# Patient Record
Sex: Female | Born: 1990 | Race: White | Hispanic: Yes | Marital: Married | Smoking: Never smoker
Health system: Southern US, Community
[De-identification: ages and names within clinical notes are randomized; demographics above are authoritative.]

## PROBLEM LIST (undated history)

## (undated) DIAGNOSIS — K819 Cholecystitis, unspecified: Secondary | ICD-10-CM

---

## 2015-05-12 ENCOUNTER — Encounter (HOSPITAL_COMMUNITY): Payer: Self-pay | Admitting: *Deleted

## 2015-05-12 ENCOUNTER — Emergency Department (HOSPITAL_COMMUNITY)
Admission: EM | Admit: 2015-05-12 | Discharge: 2015-05-12 | Disposition: A | Discharge status: Home / Self Care | Payer: Self-pay | Attending: Emergency Medicine | Admitting: Emergency Medicine

## 2015-05-12 ENCOUNTER — Emergency Department (HOSPITAL_COMMUNITY): Payer: Self-pay

## 2015-05-12 DIAGNOSIS — S99921A Unspecified injury of right foot, initial encounter: Secondary | ICD-10-CM | POA: Insufficient documentation

## 2015-05-12 DIAGNOSIS — Y998 Other external cause status: Secondary | ICD-10-CM | POA: Insufficient documentation

## 2015-05-12 DIAGNOSIS — S93401A Sprain of unspecified ligament of right ankle, initial encounter: Secondary | ICD-10-CM | POA: Insufficient documentation

## 2015-05-12 DIAGNOSIS — Y929 Unspecified place or not applicable: Secondary | ICD-10-CM | POA: Insufficient documentation

## 2015-05-12 DIAGNOSIS — Y9389 Activity, other specified: Secondary | ICD-10-CM | POA: Insufficient documentation

## 2015-05-12 DIAGNOSIS — W109XXA Fall (on) (from) unspecified stairs and steps, initial encounter: Secondary | ICD-10-CM | POA: Insufficient documentation

## 2015-05-12 MED ORDER — NAPROXEN 500 MG PO TABS: 500.0000 mg | ORAL_TABLET | Freq: Two times a day (BID) | ORAL | Status: DC

## 2015-05-12 NOTE — ED Provider Notes (Signed)
CSN: [id number]     Arrival date & time 05/12/15  2542 History  This chart was scribed for Santiago Glad, PA-C, working with Toy Cookey, MD by Chestine Spore, ED Scribe. The patient was seen in room TR06C/TR06C at 7:16 PM.    Chief Complaint  Patient presents with  . Ankle Injury      The history is provided by the patient. No language interpreter was used.    HPI Comments: Sandra Shepard is a 24 y.o. female who presents to the Emergency Department complaining of right ankle onset yesterday. Pt reports that she tripped and fell down stairs and twisted her ankle. Pt reports that she has been able to walk on the ankle and today the pain is better than yesterday. She states that she is having associated symptoms of joint swelling. She states that she has tried ibuprofen with no relief for her symptoms. She denies hitting her head, LOC, numbness, tingling, and any other symptoms.   History reviewed. No pertinent past medical history. History reviewed. No pertinent past surgical history. No family history on file. History  Substance Use Topics  . Smoking status: Never Smoker   . Smokeless tobacco: Not on file  . Alcohol Use: No   OB History    No data available     Review of Systems  Musculoskeletal: Positive for joint swelling and arthralgias. Negative for gait problem.  Neurological: Negative for syncope and numbness.      Allergies  Review of patient's allergies indicates no known allergies.  Home Medications   Prior to Admission medications   Medication Sig Start Date End Date Taking? Authorizing Provider  naproxen (NAPROSYN) 500 MG tablet Take 1 tablet (500 mg total) by mouth 2 (two) times daily. 05/12/15   Santiago Glad, PA-C   LMP 05/12/2015 Physical Exam  Constitutional: She is oriented to person, place, and time. She appears well-developed and well-nourished. No distress.  HENT:  Head: Normocephalic and atraumatic.  Eyes: EOM are normal.  Neck: Neck  supple. No tracheal deviation present.  Cardiovascular: Normal rate, regular rhythm and normal heart sounds.  Exam reveals no gallop and no friction rub.   No murmur heard. Pulses:      Dorsalis pedis pulses are 2+ on the right side.  Pulmonary/Chest: Effort normal and breath sounds normal. No respiratory distress. She has no wheezes. She has no rales.  Musculoskeletal: Normal range of motion.       Right ankle: She exhibits swelling. Tenderness. Lateral malleolus tenderness found.  Swelling over the lateral malleolus of the right foot with tenderness. Pain with flexion of the ankle.  Neurological: She is alert and oriented to person, place, and time.  Distal sensation of all toes intact.   Skin: Skin is warm and dry.  Psychiatric: She has a normal mood and affect. Her behavior is normal.  Nursing note and vitals reviewed.   ED Course  Procedures (including critical care time)    COORDINATION OF CARE: 7:59 PM-Discussed treatment plan which includes ankle ASO and crutchest with pt at bedside and pt agreed to plan.   Labs Review Labs Reviewed - No data to display  Imaging Review Dg Ankle Complete Right  05/12/2015   CLINICAL DATA:  Lateral ankle pain, status post twisting injury  EXAM: RIGHT ANKLE - COMPLETE 3+ VIEW  COMPARISON:  None.  FINDINGS: No fracture or dislocation is seen.  The ankle mortise is intact.  The base of the fifth metatarsal is unremarkable.  Moderate lateral soft  tissue swelling.  IMPRESSION: No fracture or dislocation is seen.   Electronically Signed   By: Charline Bills M.D.   On: 05/12/2015 19:13     EKG Interpretation None      MDM   Final diagnoses:  Ankle sprain, right, initial encounter   Patient presents today with right ankle pain after twisting her ankle.  Xray is negative.  Neurovascularly intact.  Patient given Ankle ASO and crutches.  Stable for discharge.  Return precautions given. I personally performed the services described in this  documentation, which was scribed in my presence. The recorded information has been reviewed and is accurate.    Santiago Glad, PA-C 05/12/15 2001  Toy Cookey, MD 05/13/15 [id number]

## 2015-05-12 NOTE — ED Notes (Signed)
Pt states she tripped and fel yesterday and twisted her rt ankle. C/o pain to area.

## 2016-05-19 ENCOUNTER — Emergency Department (HOSPITAL_COMMUNITY)
Admission: EM | Admit: 2016-05-19 | Discharge: 2016-05-20 | Disposition: A | Discharge status: Home / Self Care | Payer: Self-pay | Attending: Emergency Medicine | Admitting: Emergency Medicine

## 2016-05-19 ENCOUNTER — Encounter (HOSPITAL_COMMUNITY): Payer: Self-pay | Admitting: *Deleted

## 2016-05-19 DIAGNOSIS — K2970 Gastritis, unspecified, without bleeding: Secondary | ICD-10-CM | POA: Insufficient documentation

## 2016-05-19 DIAGNOSIS — Z79899 Other long term (current) drug therapy: Secondary | ICD-10-CM | POA: Insufficient documentation

## 2016-05-19 DIAGNOSIS — O9963 Diseases of the digestive system complicating the puerperium: Secondary | ICD-10-CM | POA: Insufficient documentation

## 2016-05-19 LAB — COMPREHENSIVE METABOLIC PANEL
ALBUMIN: 3.6 g/dL (ref 3.5–5.0)
ALT: 82 U/L — ABNORMAL HIGH (ref 14–54)
ANION GAP: 8 (ref 5–15)
AST: 103 U/L — AB (ref 15–41)
Alkaline Phosphatase: 94 U/L (ref 38–126)
BUN: 10 mg/dL (ref 6–20)
CO2: 26 mmol/L (ref 22–32)
Calcium: 9.3 mg/dL (ref 8.9–10.3)
Chloride: 106 mmol/L (ref 101–111)
Creatinine, Ser: 0.74 mg/dL (ref 0.44–1.00)
GFR calc Af Amer: >60 mL/min (ref >60)
GLUCOSE: 123 mg/dL — AB (ref 65–99)
POTASSIUM: 4 mmol/L (ref 3.5–5.1)
Sodium: 140 mmol/L (ref 135–145)
TOTAL PROTEIN: 7.4 g/dL (ref 6.5–8.1)
Total Bilirubin: 0.7 mg/dL (ref 0.3–1.2)

## 2016-05-19 LAB — URINALYSIS, ROUTINE W REFLEX MICROSCOPIC
Bilirubin Urine: NEGATIVE
Glucose, UA: NEGATIVE mg/dL
Ketones, ur: NEGATIVE mg/dL
NITRITE: NEGATIVE
Protein, ur: NEGATIVE mg/dL
SPECIFIC GRAVITY, URINE: 1.033 — AB (ref 1.005–1.030)
pH: 6 (ref 5.0–8.0)

## 2016-05-19 LAB — CBC
HEMATOCRIT: 41 % (ref 36.0–46.0)
Hemoglobin: 13 g/dL (ref 12.0–15.0)
MCH: 29 pg (ref 26.0–34.0)
MCHC: 31.7 g/dL (ref 30.0–36.0)
MCV: 91.5 fL (ref 78.0–100.0)
Platelets: 212 10*3/uL (ref 150–400)
RBC: 4.48 MIL/uL (ref 3.87–5.11)
RDW: 12.7 % (ref 11.5–15.5)
WBC: 13.4 10*3/uL — AB (ref 4.0–10.5)

## 2016-05-19 LAB — URINE MICROSCOPIC-ADD ON

## 2016-05-19 LAB — LIPASE, BLOOD: Lipase: 32 U/L (ref 11–51)

## 2016-05-19 MED ORDER — HYDROCODONE-ACETAMINOPHEN 5-325 MG PO TABS
1.0000 | ORAL_TABLET | Freq: Once | ORAL | Status: AC
  Administered 2016-05-19: 1 via ORAL
  Filled 2016-05-19: qty 1

## 2016-05-19 MED ORDER — GI COCKTAIL ~~LOC~~
30.0000 mL | Freq: Once | ORAL | Status: AC
  Administered 2016-05-19: 30 mL via ORAL
  Filled 2016-05-19: qty 30

## 2016-05-19 MED ORDER — ONDANSETRON 4 MG PO TBDP
4.0000 mg | ORAL_TABLET | Freq: Once | ORAL | Status: AC
  Administered 2016-05-19: 4 mg via ORAL
  Filled 2016-05-19: qty 1

## 2016-05-19 NOTE — ED Notes (Signed)
The pt is c/o epigastric pain for 3 weeks  Since she delivered her baby.  She is breast feeding.  The pain is associated with food and earlier today her pain increased to 10/10 lmp now from delivery date

## 2016-05-19 NOTE — ED Provider Notes (Signed)
CSN: [id number]     Arrival date & time 05/19/16  2242 History  By signing my name below, I, Sandra Shepard, attest that this documentation has been prepared under the direction and in the presence of Rolland PorterMark Ariona Deschene, MD. Electronically Signed: Bethel BornBritney Shepard, ED Scribe. 05/19/2016. 11:23 PM   Chief Complaint  Patient presents with  . Abdominal Pain   The history is provided by the patient. No language interpreter was used.   Sandra Shepard is a 25 y.o. female who presents to the Emergency Department complaining of daily, 10/10 in severity, "full", epigastric pain with onset months ago. Pt states that she had similar episodes of pain and acidic tasting emesis while pregnant. She gave birth 3 weeks ago and thought that the pain would resolve but it has persisted. The pain is typically worse after eating. She had some relief today after starting Prilosec as prescribed by her doctor but still has some pain with laying flat and standing upright. Pt is breastfeeding  History reviewed. No pertinent past medical history. History reviewed. No pertinent past surgical history. No family history on file. Social History  Substance Use Topics  . Smoking status: Never Smoker   . Smokeless tobacco: None  . Alcohol Use: No   OB History    No data available     Review of Systems  Constitutional: Negative for fever, chills, diaphoresis, appetite change and fatigue.  HENT: Negative for mouth sores, sore throat and trouble swallowing.   Eyes: Negative for visual disturbance.  Respiratory: Negative for cough, chest tightness, shortness of breath and wheezing.   Cardiovascular: Negative for chest pain.  Gastrointestinal: Positive for nausea, vomiting and abdominal pain. Negative for diarrhea and abdominal distention.  Endocrine: Negative for polydipsia, polyphagia and polyuria.  Genitourinary: Negative for dysuria, frequency and hematuria.  Musculoskeletal: Negative for gait problem.  Skin: Negative for  color change, pallor and rash.  Neurological: Negative for dizziness, syncope, light-headedness and headaches.  Hematological: Does not bruise/bleed easily.  Psychiatric/Behavioral: Negative for behavioral problems and confusion.    Allergies  Review of patient's allergies indicates no known allergies.  Home Medications   Prior to Admission medications   Medication Sig Start Date End Date Taking? Authorizing Provider  HYDROcodone-acetaminophen (NORCO/VICODIN) 5-325 MG tablet Take 2 tablets by mouth every 4 (four) hours as needed. 05/20/16   Rolland PorterMark Nena Hampe, MD  naproxen (NAPROSYN) 500 MG tablet Take 1 tablet (500 mg total) by mouth 2 (two) times daily. Patient not taking: Reported on 05/19/2016 05/12/15   Santiago GladHeather Laisure, PA-C  omeprazole (PRILOSEC) 20 MG capsule Take 1 capsule (20 mg total) by mouth 2 (two) times daily. 05/20/16   Rolland PorterMark Jovi Zavadil, MD  ondansetron (ZOFRAN ODT) 4 MG disintegrating tablet Take 1 tablet (4 mg total) by mouth every 8 (eight) hours as needed for nausea. 05/20/16   Rolland PorterMark Kadince Boxley, MD  sucralfate (CARAFATE) 1 g tablet Take 1 tablet (1 g total) by mouth 4 (four) times daily. 05/20/16   Rolland PorterMark Taelyr Jantz, MD   BP 116/87 mmHg  Pulse 68  Temp(Src) 98 F (36.7 C) (Oral)  Resp 20  Ht 5\' 3"  (1.6 m)  Wt 160 lb 3 oz (72.661 kg)  BMI 28.38 kg/m2  SpO2 100%  LMP 05/19/2016 Physical Exam  Constitutional: She is oriented to person, place, and time. She appears well-developed and well-nourished. No distress.  HENT:  Head: Normocephalic.  Eyes: Conjunctivae are normal. Pupils are equal, round, and reactive to light. No scleral icterus.  Neck: Normal range of motion.  Neck supple. No thyromegaly present.  Cardiovascular: Normal rate and regular rhythm.  Exam reveals no gallop and no friction rub.   No murmur heard. Pulmonary/Chest: Effort normal and breath sounds normal. No respiratory distress. She has no wheezes. She has no rales.  Abdominal: Soft. Bowel sounds are normal. She exhibits no  distension. There is tenderness in the epigastric area. There is no rebound.  Musculoskeletal: Normal range of motion.  Neurological: She is alert and oriented to person, place, and time.  Skin: Skin is warm and dry. No rash noted.  Psychiatric: She has a normal mood and affect. Her behavior is normal.    ED Course  Procedures (including critical care time) DIAGNOSTIC STUDIES: Oxygen Saturation is 100% on RA,  normal by my interpretation.    COORDINATION OF CARE: 11:17 PM Discussed treatment plan which includes lab work, GI cocktail, pain medication, and antiemetic medication with pt at bedside and pt agreed to plan.  Labs Review Labs Reviewed  URINE CULTURE - Abnormal; Notable for the following:    Culture MULTIPLE SPECIES PRESENT, SUGGEST RECOLLECTION (*)    All other components within normal limits  COMPREHENSIVE METABOLIC PANEL - Abnormal; Notable for the following:    Glucose, Bld 123 (*)    AST 103 (*)    ALT 82 (*)    All other components within normal limits  CBC - Abnormal; Notable for the following:    WBC 13.4 (*)    All other components within normal limits  URINALYSIS, ROUTINE W REFLEX MICROSCOPIC (NOT AT [facility]) - Abnormal; Notable for the following:    APPearance CLOUDY (*)    Specific Gravity, Urine 1.033 (*)    Hgb urine dipstick LARGE (*)    Leukocytes, UA MODERATE (*)    All other components within normal limits  URINE MICROSCOPIC-ADD ON - Abnormal; Notable for the following:    Squamous Epithelial / LPF 0-5 (*)    Bacteria, UA FEW (*)    All other components within normal limits  LIPASE, BLOOD    Imaging Review No results found. I have personally reviewed and evaluated these lab results as part of my medical decision-making.   EKG Interpretation None      MDM   Final diagnoses:  Gastritis    I personally performed the services described in this documentation, which was scribed in my presence. The recorded information has been reviewed and is  accurate.    Rolland PorterMark Tameca Jerez, MD 05/30/16 [phone]

## 2016-05-20 MED ORDER — OMEPRAZOLE 20 MG PO CPDR: 20.0000 mg | DELAYED_RELEASE_CAPSULE | Freq: Two times a day (BID) | ORAL | Status: DC

## 2016-05-20 MED ORDER — SUCRALFATE 1 G PO TABS: 1.0000 g | ORAL_TABLET | Freq: Four times a day (QID) | ORAL | Status: DC

## 2016-05-20 MED ORDER — HYDROCODONE-ACETAMINOPHEN 5-325 MG PO TABS: 2.0000 | ORAL_TABLET | ORAL | Status: DC | PRN

## 2016-05-20 MED ORDER — SUCRALFATE 1 G PO TABS
1.0000 g | ORAL_TABLET | Freq: Once | ORAL | Status: AC
  Administered 2016-05-20: 1 g via ORAL
  Filled 2016-05-20: qty 1

## 2016-05-20 MED ORDER — MORPHINE SULFATE (PF) 4 MG/ML IV SOLN
8.0000 mg | Freq: Once | INTRAVENOUS | Status: AC
  Administered 2016-05-20: 8 mg via INTRAMUSCULAR
  Filled 2016-05-20: qty 2

## 2016-05-20 MED ORDER — ONDANSETRON 4 MG PO TBDP: 4.0000 mg | ORAL_TABLET | Freq: Three times a day (TID) | ORAL | Status: DC | PRN

## 2016-05-20 NOTE — Discharge Instructions (Signed)
We will have to pump and discard your breast milk for the next 4 hours. If you continue using Vicodin more than once per day you'll have to continue to pump and discard your breast milk. Expect slow gradual improvement in your symptoms. Liquid antiacids Mylanta or Maalox as needed. Small frequent meals.   Gastritis, Adult Gastritis is soreness and puffiness (inflammation) of the lining of the stomach. If you do not get help, gastritis can cause bleeding and sores (ulcers) in the stomach. HOME CARE   Only take medicine as told by your doctor.  If you were given antibiotic medicines, take them as told. Finish the medicines even if you start to feel better.  Drink enough fluids to keep your pee (urine) clear or pale yellow.  Avoid foods and drinks that make your problems worse. Foods you may want to avoid include:  Caffeine or alcohol.  Chocolate.  Mint.  Garlic and onions.  Spicy foods.  Citrus fruits, including oranges, lemons, or limes.  Food containing tomatoes, including sauce, chili, salsa, and pizza.  Fried and fatty foods.  Eat small meals throughout the day instead of large meals. GET HELP RIGHT AWAY IF:   You have black or dark red poop (stools).  You throw up (vomit) blood. It may look like coffee grounds.  You cannot keep fluids down.  Your belly (abdominal) pain gets worse.  You have a fever.  You do not feel better after 1 week.  You have any other questions or concerns. MAKE SURE YOU:   Understand these instructions.  Will watch your condition.  Will get help right away if you are not doing well or get worse.   This information is not intended to replace advice given to you by your health care provider. Make sure you discuss any questions you have with your health care provider.   Document Released: 04/22/2008 Document Revised: 01/27/2012 Document Reviewed: 12/18/2011 Elsevier Interactive Patient Education [employer]2016 [employer] Inc[employer]

## 2016-05-21 LAB — URINE CULTURE

## 2016-06-16 ENCOUNTER — Encounter (HOSPITAL_COMMUNITY): Payer: Self-pay | Admitting: Emergency Medicine

## 2016-06-16 ENCOUNTER — Emergency Department (HOSPITAL_COMMUNITY)
Admission: EM | Admit: 2016-06-16 | Discharge: 2016-06-16 | Disposition: A | Discharge status: Home / Self Care | Payer: Self-pay | Attending: Emergency Medicine | Admitting: Emergency Medicine

## 2016-06-16 DIAGNOSIS — Z5321 Procedure and treatment not carried out due to patient leaving prior to being seen by health care provider: Secondary | ICD-10-CM | POA: Insufficient documentation

## 2016-06-16 DIAGNOSIS — R1011 Right upper quadrant pain: Secondary | ICD-10-CM | POA: Insufficient documentation

## 2016-06-16 LAB — URINALYSIS, ROUTINE W REFLEX MICROSCOPIC
Bilirubin Urine: NEGATIVE
GLUCOSE, UA: NEGATIVE mg/dL
Hgb urine dipstick: NEGATIVE
Ketones, ur: NEGATIVE mg/dL
NITRITE: NEGATIVE
PH: 5 (ref 5.0–8.0)
Protein, ur: NEGATIVE mg/dL
Specific Gravity, Urine: 1.028 (ref 1.005–1.030)

## 2016-06-16 LAB — URINE MICROSCOPIC-ADD ON

## 2016-06-16 LAB — CBC
HEMATOCRIT: 39.6 % (ref 36.0–46.0)
Hemoglobin: 12.7 g/dL (ref 12.0–15.0)
MCH: 29.3 pg (ref 26.0–34.0)
MCHC: 32.1 g/dL (ref 30.0–36.0)
MCV: 91.2 fL (ref 78.0–100.0)
Platelets: 208 10*3/uL (ref 150–400)
RBC: 4.34 MIL/uL (ref 3.87–5.11)
RDW: 13.3 % (ref 11.5–15.5)
WBC: 8.4 10*3/uL (ref 4.0–10.5)

## 2016-06-16 LAB — COMPREHENSIVE METABOLIC PANEL
ALBUMIN: 4 g/dL (ref 3.5–5.0)
ALT: 63 U/L — ABNORMAL HIGH (ref 14–54)
AST: 48 U/L — AB (ref 15–41)
Alkaline Phosphatase: 75 U/L (ref 38–126)
Anion gap: 8 (ref 5–15)
BILIRUBIN TOTAL: 0.9 mg/dL (ref 0.3–1.2)
BUN: 13 mg/dL (ref 6–20)
CO2: 25 mmol/L (ref 22–32)
Calcium: 9.2 mg/dL (ref 8.9–10.3)
Chloride: 106 mmol/L (ref 101–111)
Creatinine, Ser: 0.67 mg/dL (ref 0.44–1.00)
GFR calc Af Amer: >60 mL/min (ref >60)
GFR calc non Af Amer: >60 mL/min (ref >60)
GLUCOSE: 130 mg/dL — AB (ref 65–99)
POTASSIUM: 3.5 mmol/L (ref 3.5–5.1)
Sodium: 139 mmol/L (ref 135–145)
TOTAL PROTEIN: 7.5 g/dL (ref 6.5–8.1)

## 2016-06-16 LAB — POC URINE PREG, ED: PREG TEST UR: NEGATIVE

## 2016-06-16 LAB — LIPASE, BLOOD: Lipase: 32 U/L (ref 11–51)

## 2016-06-16 NOTE — ED Notes (Signed)
Patient decided that she didn't want to be seen cause she felt better.We try to convince her stay.Marland KitchenMarland KitchenPatient stated that she was just going to go ahead and leave.Marland Kitchen

## 2016-06-16 NOTE — ED Triage Notes (Signed)
Pt. reports mid /RUQ abdominal pain with emesis onset yesterday , denies diarrhea , no fever or chills.

## 2016-06-18 DIAGNOSIS — K819 Cholecystitis, unspecified: Secondary | ICD-10-CM

## 2016-06-18 HISTORY — DX: Cholecystitis, unspecified: K81.9

## 2016-06-25 ENCOUNTER — Encounter (HOSPITAL_COMMUNITY): Payer: Self-pay

## 2016-06-25 ENCOUNTER — Observation Stay (HOSPITAL_COMMUNITY)
Admission: EM | Admit: 2016-06-25 | Discharge: 2016-06-27 | Disposition: A | Discharge status: Home / Self Care | Payer: Self-pay | Attending: Surgery | Admitting: Surgery

## 2016-06-25 DIAGNOSIS — K8012 Calculus of gallbladder with acute and chronic cholecystitis without obstruction: Principal | ICD-10-CM | POA: Insufficient documentation

## 2016-06-25 DIAGNOSIS — K8020 Calculus of gallbladder without cholecystitis without obstruction: Secondary | ICD-10-CM | POA: Diagnosis present

## 2016-06-25 DIAGNOSIS — R1011 Right upper quadrant pain: Secondary | ICD-10-CM

## 2016-06-25 DIAGNOSIS — Z419 Encounter for procedure for purposes other than remedying health state, unspecified: Secondary | ICD-10-CM

## 2016-06-25 HISTORY — DX: Cholecystitis, unspecified: K81.9

## 2016-06-25 LAB — URINALYSIS, ROUTINE W REFLEX MICROSCOPIC
Bilirubin Urine: NEGATIVE
GLUCOSE, UA: NEGATIVE mg/dL
Ketones, ur: NEGATIVE mg/dL
NITRITE: NEGATIVE
PH: 7.5 (ref 5.0–8.0)
Protein, ur: NEGATIVE mg/dL
Specific Gravity, Urine: 1.025 (ref 1.005–1.030)

## 2016-06-25 LAB — COMPREHENSIVE METABOLIC PANEL
ALT: 40 U/L (ref 14–54)
ANION GAP: 5 (ref 5–15)
AST: 34 U/L (ref 15–41)
Albumin: 4.1 g/dL (ref 3.5–5.0)
Alkaline Phosphatase: 79 U/L (ref 38–126)
BUN: 7 mg/dL (ref 6–20)
CHLORIDE: 105 mmol/L (ref 101–111)
CO2: 29 mmol/L (ref 22–32)
Calcium: 9.5 mg/dL (ref 8.9–10.3)
Creatinine, Ser: 0.67 mg/dL (ref 0.44–1.00)
Glucose, Bld: 99 mg/dL (ref 65–99)
POTASSIUM: 3.8 mmol/L (ref 3.5–5.1)
Sodium: 139 mmol/L (ref 135–145)
Total Bilirubin: 0.5 mg/dL (ref 0.3–1.2)
Total Protein: 7.6 g/dL (ref 6.5–8.1)

## 2016-06-25 LAB — CBC
HEMATOCRIT: 40 % (ref 36.0–46.0)
HEMOGLOBIN: 13 g/dL (ref 12.0–15.0)
MCH: 29.6 pg (ref 26.0–34.0)
MCHC: 32.5 g/dL (ref 30.0–36.0)
MCV: 91.1 fL (ref 78.0–100.0)
Platelets: 261 10*3/uL (ref 150–400)
RBC: 4.39 MIL/uL (ref 3.87–5.11)
RDW: 12.8 % (ref 11.5–15.5)
WBC: 9.2 10*3/uL (ref 4.0–10.5)

## 2016-06-25 LAB — URINE MICROSCOPIC-ADD ON

## 2016-06-25 LAB — POC URINE PREG, ED: Preg Test, Ur: NEGATIVE

## 2016-06-25 LAB — LIPASE, BLOOD: LIPASE: 28 U/L (ref 11–51)

## 2016-06-25 NOTE — ED Triage Notes (Signed)
Pt c/o RUQ abd pain since she was 5 months pregnant. Vag del 04-27-16.  Pt thought when she had baby pain would stop.  She has been seen in ED for same and dx w/gastritis.  Pain worse after eating sometimes and nothing makes better.  Associated with nausea.  No vomiting, diarrhea,

## 2016-06-26 ENCOUNTER — Encounter (HOSPITAL_COMMUNITY)
Admission: EM | Disposition: A | Discharge status: Home / Self Care | Payer: Self-pay | Source: Home / Self Care | Attending: Emergency Medicine

## 2016-06-26 ENCOUNTER — Emergency Department (HOSPITAL_COMMUNITY): Payer: Self-pay

## 2016-06-26 ENCOUNTER — Observation Stay (HOSPITAL_COMMUNITY): Payer: Self-pay | Admitting: Certified Registered Nurse Anesthetist

## 2016-06-26 ENCOUNTER — Encounter (HOSPITAL_COMMUNITY): Payer: Self-pay | Admitting: General Practice

## 2016-06-26 ENCOUNTER — Observation Stay (HOSPITAL_COMMUNITY): Payer: Self-pay

## 2016-06-26 DIAGNOSIS — K8020 Calculus of gallbladder without cholecystitis without obstruction: Secondary | ICD-10-CM | POA: Diagnosis present

## 2016-06-26 HISTORY — PX: CHOLECYSTECTOMY: SHX55

## 2016-06-26 LAB — SURGICAL PCR SCREEN
MRSA, PCR: NEGATIVE
Staphylococcus aureus: NEGATIVE

## 2016-06-26 SURGERY — LAPAROSCOPIC CHOLECYSTECTOMY WITH INTRAOPERATIVE CHOLANGIOGRAM
Anesthesia: General | Site: Abdomen

## 2016-06-26 MED ORDER — EPHEDRINE SULFATE 50 MG/ML IJ SOLN
INTRAMUSCULAR | Status: DC | PRN
  Administered 2016-06-26: 5 mg via INTRAVENOUS

## 2016-06-26 MED ORDER — FAMOTIDINE IN NACL 20-0.9 MG/50ML-% IV SOLN
20.0000 mg | Freq: Two times a day (BID) | INTRAVENOUS | Status: DC
  Administered 2016-06-26 – 2016-06-27 (×2): 20 mg via INTRAVENOUS
  Filled 2016-06-26 (×3): qty 50

## 2016-06-26 MED ORDER — SODIUM CHLORIDE 0.9 % IR SOLN
Status: DC | PRN
  Administered 2016-06-26: 1000 mL

## 2016-06-26 MED ORDER — DEXTROSE-NACL 5-0.9 % IV SOLN
INTRAVENOUS | Status: DC
  Administered 2016-06-26 – 2016-06-27 (×4): via INTRAVENOUS

## 2016-06-26 MED ORDER — HYDROMORPHONE HCL 1 MG/ML IJ SOLN: 0.5000 mg | INTRAMUSCULAR | Status: DC | PRN

## 2016-06-26 MED ORDER — DIPHENHYDRAMINE HCL 50 MG/ML IJ SOLN: 12.5000 mg | Freq: Four times a day (QID) | INTRAMUSCULAR | Status: DC | PRN

## 2016-06-26 MED ORDER — PHENYLEPHRINE 40 MCG/ML (10ML) SYRINGE FOR IV PUSH (FOR BLOOD PRESSURE SUPPORT)
PREFILLED_SYRINGE | INTRAVENOUS | Status: AC
  Filled 2016-06-26: qty 10

## 2016-06-26 MED ORDER — KETOROLAC TROMETHAMINE 30 MG/ML IJ SOLN
INTRAMUSCULAR | Status: DC | PRN
  Administered 2016-06-26: 30 mg via INTRAVENOUS

## 2016-06-26 MED ORDER — ACETAMINOPHEN 325 MG PO TABS
650.0000 mg | ORAL_TABLET | Freq: Four times a day (QID) | ORAL | Status: DC | PRN
  Administered 2016-06-26 – 2016-06-27 (×3): 650 mg via ORAL
  Filled 2016-06-26 (×3): qty 2

## 2016-06-26 MED ORDER — LIDOCAINE HCL (CARDIAC) 20 MG/ML IV SOLN
INTRAVENOUS | Status: DC | PRN
  Administered 2016-06-26: 40 mg via INTRAVENOUS

## 2016-06-26 MED ORDER — FENTANYL CITRATE (PF) 100 MCG/2ML IJ SOLN
INTRAMUSCULAR | Status: DC | PRN
  Administered 2016-06-26 (×2): 50 ug via INTRAVENOUS
  Administered 2016-06-26: 150 ug via INTRAVENOUS

## 2016-06-26 MED ORDER — LIDOCAINE 2% (20 MG/ML) 5 ML SYRINGE
INTRAMUSCULAR | Status: AC
  Filled 2016-06-26: qty 5

## 2016-06-26 MED ORDER — GLYCOPYRROLATE 0.2 MG/ML IJ SOLN
INTRAMUSCULAR | Status: DC | PRN
  Administered 2016-06-26: 0.2 mg via INTRAVENOUS
  Administered 2016-06-26: .4 mg via INTRAVENOUS

## 2016-06-26 MED ORDER — ROCURONIUM BROMIDE 10 MG/ML (PF) SYRINGE
PREFILLED_SYRINGE | INTRAVENOUS | Status: AC
  Filled 2016-06-26: qty 10

## 2016-06-26 MED ORDER — DIPHENHYDRAMINE HCL 12.5 MG/5ML PO ELIX: 12.5000 mg | ORAL_SOLUTION | Freq: Four times a day (QID) | ORAL | Status: DC | PRN

## 2016-06-26 MED ORDER — BUPIVACAINE-EPINEPHRINE (PF) 0.25% -1:200000 IJ SOLN
INTRAMUSCULAR | Status: AC
  Filled 2016-06-26: qty 30

## 2016-06-26 MED ORDER — ONDANSETRON HCL 4 MG/2ML IJ SOLN
INTRAMUSCULAR | Status: DC | PRN
Start: 2016-06-26 — End: 2016-06-26
  Administered 2016-06-26: 4 mg via INTRAVENOUS

## 2016-06-26 MED ORDER — MORPHINE SULFATE (PF) 4 MG/ML IV SOLN
INTRAVENOUS | Status: AC
  Filled 2016-06-26: qty 1

## 2016-06-26 MED ORDER — SODIUM CHLORIDE 0.9 % IV SOLN
INTRAVENOUS | Status: DC | PRN
  Administered 2016-06-26: 12 mL

## 2016-06-26 MED ORDER — ACETAMINOPHEN 650 MG RE SUPP: 650.0000 mg | Freq: Four times a day (QID) | RECTAL | Status: DC | PRN

## 2016-06-26 MED ORDER — SIMETHICONE 80 MG PO CHEW: 40.0000 mg | CHEWABLE_TABLET | Freq: Four times a day (QID) | ORAL | Status: DC | PRN

## 2016-06-26 MED ORDER — ONDANSETRON 4 MG PO TBDP
4.0000 mg | ORAL_TABLET | Freq: Four times a day (QID) | ORAL | Status: DC | PRN
Start: 2016-06-26 — End: 2016-06-26

## 2016-06-26 MED ORDER — DEXTROSE 5 % IV SOLN
INTRAVENOUS | Status: AC
  Filled 2016-06-26: qty 2

## 2016-06-26 MED ORDER — ONDANSETRON HCL 4 MG/2ML IJ SOLN
INTRAMUSCULAR | Status: AC
Start: 2016-06-26 — End: 2016-06-26
  Filled 2016-06-26: qty 2

## 2016-06-26 MED ORDER — CEFAZOLIN SODIUM-DEXTROSE 2-4 GM/100ML-% IV SOLN
2.0000 g | Freq: Once | INTRAVENOUS | Status: AC
  Administered 2016-06-26: 2 g via INTRAVENOUS
  Filled 2016-06-26: qty 100

## 2016-06-26 MED ORDER — PROPOFOL 10 MG/ML IV BOLUS
INTRAVENOUS | Status: AC
  Filled 2016-06-26: qty 20

## 2016-06-26 MED ORDER — SODIUM CHLORIDE 0.9 % IV SOLN
INTRAVENOUS | Status: DC
  Administered 2016-06-26: 04:00:00 via INTRAVENOUS

## 2016-06-26 MED ORDER — ENOXAPARIN SODIUM 40 MG/0.4ML ~~LOC~~ SOLN
40.0000 mg | SUBCUTANEOUS | Status: DC
  Administered 2016-06-27: 40 mg via SUBCUTANEOUS
  Filled 2016-06-26: qty 0.4

## 2016-06-26 MED ORDER — ONDANSETRON HCL 4 MG/2ML IJ SOLN
4.0000 mg | Freq: Four times a day (QID) | INTRAMUSCULAR | Status: DC | PRN
  Administered 2016-06-26 – 2016-06-27 (×4): 4 mg via INTRAVENOUS
  Filled 2016-06-26 (×4): qty 2

## 2016-06-26 MED ORDER — MIDAZOLAM HCL 5 MG/5ML IJ SOLN
INTRAMUSCULAR | Status: DC | PRN
  Administered 2016-06-26 (×2): 1 mg via INTRAVENOUS

## 2016-06-26 MED ORDER — FENTANYL CITRATE (PF) 250 MCG/5ML IJ SOLN
INTRAMUSCULAR | Status: AC
  Filled 2016-06-26: qty 5

## 2016-06-26 MED ORDER — ONDANSETRON HCL 4 MG/2ML IJ SOLN
INTRAMUSCULAR | Status: AC
  Filled 2016-06-26: qty 2

## 2016-06-26 MED ORDER — OXYCODONE HCL 5 MG PO TABS
5.0000 mg | ORAL_TABLET | ORAL | Status: DC | PRN
  Administered 2016-06-26 – 2016-06-27 (×3): 10 mg via ORAL
  Administered 2016-06-27 (×2): 5 mg via ORAL
  Filled 2016-06-26: qty 1
  Filled 2016-06-26 (×2): qty 2
  Filled 2016-06-26: qty 1
  Filled 2016-06-26: qty 2

## 2016-06-26 MED ORDER — IOPAMIDOL (ISOVUE-300) INJECTION 61%
INTRAVENOUS | Status: AC
  Filled 2016-06-26: qty 50

## 2016-06-26 MED ORDER — ONDANSETRON HCL 4 MG/2ML IJ SOLN: 4.0000 mg | Freq: Four times a day (QID) | INTRAMUSCULAR | Status: DC | PRN

## 2016-06-26 MED ORDER — MORPHINE SULFATE (PF) 2 MG/ML IV SOLN: 1.0000 mg | INTRAVENOUS | Status: DC | PRN

## 2016-06-26 MED ORDER — HYDROMORPHONE HCL 1 MG/ML IJ SOLN: 1.0000 mg | INTRAMUSCULAR | Status: DC | PRN

## 2016-06-26 MED ORDER — DEXTROSE 5 % IV SOLN
2.0000 g | INTRAVENOUS | Status: DC
  Administered 2016-06-26: 2 g via INTRAVENOUS

## 2016-06-26 MED ORDER — ONDANSETRON HCL 4 MG/2ML IJ SOLN
4.0000 mg | Freq: Once | INTRAMUSCULAR | Status: AC
  Administered 2016-06-26: 4 mg via INTRAVENOUS

## 2016-06-26 MED ORDER — MORPHINE SULFATE (PF) 4 MG/ML IV SOLN
INTRAVENOUS | Status: AC
  Administered 2016-06-26: 4 mg
  Filled 2016-06-26: qty 1

## 2016-06-26 MED ORDER — MORPHINE SULFATE (PF) 4 MG/ML IV SOLN: 4.0000 mg | Freq: Once | INTRAVENOUS | Status: DC

## 2016-06-26 MED ORDER — PROPOFOL 10 MG/ML IV BOLUS
INTRAVENOUS | Status: DC | PRN
  Administered 2016-06-26: 130 mg via INTRAVENOUS
  Administered 2016-06-26: 30 mg via INTRAVENOUS

## 2016-06-26 MED ORDER — SODIUM CHLORIDE 0.9 % IV SOLN: INTRAVENOUS | Status: DC

## 2016-06-26 MED ORDER — BUPIVACAINE-EPINEPHRINE 0.25% -1:200000 IJ SOLN
INTRAMUSCULAR | Status: DC | PRN
  Administered 2016-06-26: 30 mL

## 2016-06-26 MED ORDER — PHENYLEPHRINE HCL 10 MG/ML IJ SOLN
INTRAMUSCULAR | Status: DC | PRN
  Administered 2016-06-26: 40 ug via INTRAVENOUS
  Administered 2016-06-26: 80 ug via INTRAVENOUS

## 2016-06-26 MED ORDER — LACTATED RINGERS IV SOLN
INTRAVENOUS | Status: DC | PRN
Start: 2016-06-26 — End: 2016-06-26
  Administered 2016-06-26 (×2): via INTRAVENOUS

## 2016-06-26 MED ORDER — NEOSTIGMINE METHYLSULFATE 10 MG/10ML IV SOLN
INTRAVENOUS | Status: DC | PRN
  Administered 2016-06-26: 1 mg via INTRAVENOUS
  Administered 2016-06-26: 3 mg via INTRAVENOUS

## 2016-06-26 MED ORDER — KETOROLAC TROMETHAMINE 30 MG/ML IJ SOLN
INTRAMUSCULAR | Status: AC
  Filled 2016-06-26: qty 2

## 2016-06-26 MED ORDER — ONDANSETRON 4 MG PO TBDP: 4.0000 mg | ORAL_TABLET | Freq: Four times a day (QID) | ORAL | Status: DC | PRN

## 2016-06-26 MED ORDER — 0.9 % SODIUM CHLORIDE (POUR BTL) OPTIME
TOPICAL | Status: DC | PRN
  Administered 2016-06-26: 1000 mL

## 2016-06-26 MED ORDER — MIDAZOLAM HCL 2 MG/2ML IJ SOLN
INTRAMUSCULAR | Status: AC
  Filled 2016-06-26: qty 2

## 2016-06-26 MED ORDER — ROCURONIUM BROMIDE 100 MG/10ML IV SOLN
INTRAVENOUS | Status: DC | PRN
  Administered 2016-06-26: 10 mg via INTRAVENOUS
  Administered 2016-06-26: 40 mg via INTRAVENOUS

## 2016-06-26 MED ORDER — EPHEDRINE 5 MG/ML INJ
INTRAVENOUS | Status: AC
  Filled 2016-06-26: qty 10

## 2016-06-26 SURGICAL SUPPLY — 43 items
APPLIER CLIP 5 13 M/L LIGAMAX5 (MISCELLANEOUS) ×3
BANDAGE ADH SHEER 1  50/CT (GAUZE/BANDAGES/DRESSINGS) ×9 IMPLANT
BENZOIN TINCTURE PRP APPL 2/3 (GAUZE/BANDAGES/DRESSINGS) ×3 IMPLANT
BLADE SURG ROTATE 9660 (MISCELLANEOUS) IMPLANT
CANISTER SUCTION 2500CC (MISCELLANEOUS) ×3 IMPLANT
CHLORAPREP W/TINT 26ML (MISCELLANEOUS) ×3 IMPLANT
CLIP APPLIE 5 13 M/L LIGAMAX5 (MISCELLANEOUS) ×1 IMPLANT
CLOSURE WOUND 1/2 X4 (GAUZE/BANDAGES/DRESSINGS) ×1
COVER MAYO STAND STRL (DRAPES) ×3 IMPLANT
COVER SURGICAL LIGHT HANDLE (MISCELLANEOUS) ×3 IMPLANT
DRAPE C-ARM 42X72 X-RAY (DRAPES) ×3 IMPLANT
DRSG TEGADERM 4X4.75 (GAUZE/BANDAGES/DRESSINGS) ×3 IMPLANT
ELECT REM PT RETURN 9FT ADLT (ELECTROSURGICAL) ×3
ELECTRODE REM PT RTRN 9FT ADLT (ELECTROSURGICAL) ×1 IMPLANT
GAUZE SPONGE 2X2 8PLY STRL LF (GAUZE/BANDAGES/DRESSINGS) ×1 IMPLANT
GLOVE BIOGEL M STRL SZ7.5 (GLOVE) ×3 IMPLANT
GLOVE BIOGEL PI IND STRL 8 (GLOVE) ×1 IMPLANT
GLOVE BIOGEL PI INDICATOR 8 (GLOVE) ×2
GOWN STRL REUS W/ TWL LRG LVL3 (GOWN DISPOSABLE) ×3 IMPLANT
GOWN STRL REUS W/ TWL XL LVL3 (GOWN DISPOSABLE) ×1 IMPLANT
GOWN STRL REUS W/TWL LRG LVL3 (GOWN DISPOSABLE) ×6
GOWN STRL REUS W/TWL XL LVL3 (GOWN DISPOSABLE) ×2
KIT BASIN OR (CUSTOM PROCEDURE TRAY) ×3 IMPLANT
KIT ROOM TURNOVER OR (KITS) ×3 IMPLANT
NEEDLE HYPO 25GX1X1/2 BEV (NEEDLE) ×6 IMPLANT
NS IRRIG 1000ML POUR BTL (IV SOLUTION) ×3 IMPLANT
PAD ARMBOARD 7.5X6 YLW CONV (MISCELLANEOUS) ×3 IMPLANT
POUCH RETRIEVAL ECOSAC 10 (ENDOMECHANICALS) ×1 IMPLANT
POUCH RETRIEVAL ECOSAC 10MM (ENDOMECHANICALS) ×2
SCISSORS LAP 5X35 DISP (ENDOMECHANICALS) ×3 IMPLANT
SET CHOLANGIOGRAPH 5 50 .035 (SET/KITS/TRAYS/PACK) ×3 IMPLANT
SET IRRIG TUBING LAPAROSCOPIC (IRRIGATION / IRRIGATOR) ×3 IMPLANT
SLEEVE ENDOPATH XCEL 5M (ENDOMECHANICALS) ×6 IMPLANT
SPECIMEN JAR SMALL (MISCELLANEOUS) ×3 IMPLANT
SPONGE GAUZE 2X2 STER 10/PKG (GAUZE/BANDAGES/DRESSINGS) ×2
STRIP CLOSURE SKIN 1/2X4 (GAUZE/BANDAGES/DRESSINGS) ×2 IMPLANT
SUT MNCRL AB 4-0 PS2 18 (SUTURE) ×6 IMPLANT
TOWEL OR 17X24 6PK STRL BLUE (TOWEL DISPOSABLE) ×3 IMPLANT
TOWEL OR 17X26 10 PK STRL BLUE (TOWEL DISPOSABLE) ×3 IMPLANT
TRAY LAPAROSCOPIC MC (CUSTOM PROCEDURE TRAY) ×3 IMPLANT
TROCAR XCEL BLUNT TIP 100MML (ENDOMECHANICALS) ×3 IMPLANT
TROCAR XCEL NON-BLD 5MMX100MML (ENDOMECHANICALS) ×3 IMPLANT
TUBING INSUFFLATION (TUBING) ×3 IMPLANT

## 2016-06-26 NOTE — Op Note (Signed)
Sandra Shepard [id number] December 21, 1990 06/26/2016  Laparoscopic Cholecystectomy with IOC Procedure Note  Indications: This patient presents with symptomatic gallbladder disease and will undergo laparoscopic cholecystectomy. Please see h&p for additional details.   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 The risks, benefits, complications, treatment options, and expected outcomes were discussed with the patient. The possibilities of reaction to medication, pulmonary aspiration, perforation of viscus, bleeding, recurrent infection, finding a normal gallbladder, the need for additional procedures, failure to diagnose a condition, the possible need to convert to an open procedure, and creating a complication requiring transfusion or operation were discussed with the patient. The likelihood of improving the patient's symptoms with return to their baseline status is good.  The patient and/or family concurred with the proposed plan, giving informed consent. The site of surgery properly noted. The patient was taken to Operating Room, identified as Sandra Shepard and the procedure verified as Laparoscopic Cholecystectomy with Intraoperative Cholangiogram. A Time Out was held and the above information confirmed. Antibiotic prophylaxis was administered.   Prior to the induction of general anesthesia, antibiotic prophylaxis was administered. General endotracheal anesthesia was then administered and tolerated well. After the induction, the abdomen was prepped with Chloraprep and draped in the sterile fashion. The patient was positioned in the supine position.  Local anesthetic agent was injected into the skin near the umbilicus and an incision made. We dissected down to the  abdominal fascia with blunt dissection.  The fascia was incised vertically and we entered the peritoneal cavity bluntly.  A pursestring suture of 0-Vicryl was placed around the fascial opening.  The Hasson cannula was inserted and secured with the stay suture.  Pneumoperitoneum was then created with CO2 and tolerated well without any adverse changes in the patient's vital signs. An 5-mm port was placed in the subxiphoid position.  Two 5-mm ports were placed in the right upper quadrant. All skin incisions were infiltrated with a local anesthetic agent before making the incision and placing the trocars.   We positioned the patient in reverse Trendelenburg, tilted slightly to the patient's left.  The gallbladder was identified, the fundus grasped and retracted cephalad. Adhesions were lysed bluntly and with the electrocautery where indicated, taking care not to injure any adjacent organs or viscus. The infundibulum was grasped and retracted laterally, exposing the peritoneum overlying the triangle of Calot. This was then divided and exposed in a blunt fashion. A critical view of the cystic duct and cystic artery was obtained.  The cystic duct was clearly identified and bluntly dissected circumferentially. The cystic duct was ligated with a clip distally.   An incision was made in the cystic duct and the [facility] cholangiogram catheter introduced. The catheter was secured using a clip. A cholangiogram was then obtained which showed good visualization of the distal and proximal biliary tree with no sign of filling defects or obstruction.  Contrast flowed easily into the duodenum. The catheter was then removed.   The cystic duct was then ligated with clips and divided. The cystic artery which had been identified & dissected free was ligated with clips and divided as well.   The gallbladder was dissected from the liver bed in retrograde fashion with the electrocautery. The gallbladder was removed and placed in an Ecco  sac.  The gallbladder and Ecco sac were then removed through  the umbilical port site. The liver bed was irrigated and inspected. Hemostasis was achieved with the electrocautery. Copious irrigation was utilized and was repeatedly aspirated until clear.  The pursestring suture was used to close the umbilical fascia.    We again inspected the right upper quadrant for hemostasis.  The umbilical closure was inspected and there was no air leak and nothing trapped within the closure. Pneumoperitoneum was released as we removed the trocars.  4-0 Monocryl was used to close the skin.   Benzoin, steri-strips, and clean dressings were applied. The patient was then extubated and brought to the recovery room in stable condition. Instrument, sponge, and needle counts were correct at closure and at the conclusion of the case.   Findings: Cholecystitis with Cholelithiasis  Estimated Blood Loss: Minimal         Drains: none         Specimens: Gallbladder           Complications: None; patient tolerated the procedure well.         Disposition: PACU - hemodynamically stable.         Condition: stable  Mary SellaEric M. Andrey CampanileWilson, MD, FACS General, Bariatric, & Minimally Invasive Surgery [facility] [place]

## 2016-06-26 NOTE — H&P (Signed)
Sandra Shepard is an 25 y.o. female.   Chief Complaint: Epigastric abdominal pain HPI: Patient is a 25 year old female with approximately a 2 month history of on and off right upper quadrant/epigastric abdominal pain. Patient states that multiple bouts since been pregnant. Patient's pregnancy was delivered on June 10. Patient states that this episode began last night after eating melon, and She states this was associated with nausea and vomiting. Secondary to continued pain and nausea patient proceeded to the ER for further evaluation  Upon evaluation ER patient underwent ultrasound which revealed a gallbladder with multiple gallstones. Patient also had a common bile duct of 1 cm. There is no common duct stones. Patient's laboratory values were within normal limits.  Surgical consultation was requested for further treatment.  History reviewed. No pertinent past medical history.  History reviewed. No pertinent surgical history.  History reviewed. No pertinent family history. Social History:  reports that she has never smoked. She has never used smokeless tobacco. She reports that she does not drink alcohol or use drugs.  Allergies: No Known Allergies   (Not in a hospital admission)  Results for orders placed or performed during the hospital encounter of 06/25/16 (from the past 48 hour(s))  Lipase, blood     Status: None   Collection Time: 06/25/16  7:57 PM  Result Value Ref Range   Lipase 28 11 - 51 U/L  Comprehensive metabolic panel     Status: None   Collection Time: 06/25/16  7:57 PM  Result Value Ref Range   Sodium 139 135 - 145 mmol/L   Potassium 3.8 3.5 - 5.1 mmol/L   Chloride 105 101 - 111 mmol/L   CO2 29 22 - 32 mmol/L   Glucose, Bld 99 65 - 99 mg/dL   BUN 7 6 - 20 mg/dL   Creatinine, Ser 0.67 0.44 - 1.00 mg/dL   Calcium 9.5 8.9 - 10.3 mg/dL   Total Protein 7.6 6.5 - 8.1 g/dL   Albumin 4.1 3.5 - 5.0 g/dL   AST 34 15 - 41 U/L   ALT 40 14 - 54 U/L   Alkaline Phosphatase  79 38 - 126 U/L   Total Bilirubin 0.5 0.3 - 1.2 mg/dL   GFR calc non Af Amer >60 >60 mL/min   GFR calc Af Amer >60 >60 mL/min    Comment: (NOTE) The eGFR has been calculated using the CKD EPI equation. This calculation has not been validated in all clinical situations. eGFR's persistently <60 mL/min signify possible Chronic Kidney Disease.    Anion gap 5 5 - 15  CBC     Status: None   Collection Time: 06/25/16  7:57 PM  Result Value Ref Range   WBC 9.2 4.0 - 10.5 K/uL   RBC 4.39 3.87 - 5.11 MIL/uL   Hemoglobin 13.0 12.0 - 15.0 g/dL   HCT 40.0 36.0 - 46.0 %   MCV 91.1 78.0 - 100.0 fL   MCH 29.6 26.0 - 34.0 pg   MCHC 32.5 30.0 - 36.0 g/dL   RDW 12.8 11.5 - 15.5 %   Platelets 261 150 - 400 K/uL  Urinalysis, Routine w reflex microscopic     Status: Abnormal   Collection Time: 06/25/16  8:00 PM  Result Value Ref Range   Color, Urine YELLOW YELLOW   APPearance CLOUDY (A) CLEAR   Specific Gravity, Urine 1.025 1.005 - 1.030   pH 7.5 5.0 - 8.0   Glucose, UA NEGATIVE NEGATIVE mg/dL   Hgb urine dipstick LARGE (  A) NEGATIVE   Bilirubin Urine NEGATIVE NEGATIVE   Ketones, ur NEGATIVE NEGATIVE mg/dL   Protein, ur NEGATIVE NEGATIVE mg/dL   Nitrite NEGATIVE NEGATIVE   Leukocytes, UA MODERATE (A) NEGATIVE  Urine microscopic-add on     Status: Abnormal   Collection Time: 06/25/16  8:00 PM  Result Value Ref Range   Squamous Epithelial / LPF 6-30 (A) NONE SEEN   WBC, UA 0-5 0 - 5 WBC/hpf   RBC / HPF 0-5 0 - 5 RBC/hpf   Bacteria, UA MANY (A) NONE SEEN   Urine-Other MUCOUS PRESENT   POC Urine Pregnancy, ED (do NOT order at [facility])     Status: None   Collection Time: 06/25/16  8:27 PM  Result Value Ref Range   Preg Test, Ur NEGATIVE NEGATIVE    Comment:        THE SENSITIVITY OF THIS METHODOLOGY IS >24 mIU/mL    US Abdomen Complete  Result Date: 06/26/2016 CLINICAL DATA:  Initial evaluation for acute right upper quadrant abdominal pain. EXAM: ABDOMEN ULTRASOUND COMPLETE COMPARISON:  None.  FINDINGS: Gallbladder: Several echogenic stones present within the gallbladder lumen, largest of which measured approximately 7 mm. Gallbladder wall measure within normal limits at up to 3 mm. No free pericholecystic fluid. No sonographic Murphy sign elicited on exam. Common bile duct: Diameter: Dilated up to 1 cm. No choledocholithiasis identified on this exam. Liver: No focal lesion identified. Within normal limits in parenchymal echogenicity. IVC: No abnormality visualized. Pancreas: Visualized portion unremarkable. Spleen: Size and appearance within normal limits. Right Kidney: Length: 10.3 cm. Echogenicity within normal limits. No mass or hydronephrosis visualized. Left Kidney: Length: 8.3 cm. Echogenicity within normal limits. No mass or hydronephrosis visualized. Abdominal aorta: No aneurysm visualized. Other findings: None. IMPRESSION: 1. Cholelithiasis without other sonographic features to suggest acute cholecystitis. 2. Dilatation of the common bile duct up to 1 cm. No choledocholithiasis identified on this exam. 3. Otherwise negative abdominal ultrasound. Electronically Signed   By: Jeannine Boga M.D.   On: 06/26/2016 02:08    Review of Systems  Constitutional: Negative for chills, diaphoresis, fever, malaise/fatigue and weight loss.  HENT: Negative for ear pain, hearing loss and tinnitus.   Eyes: Negative for blurred vision, double vision and photophobia.  Respiratory: Negative for cough, hemoptysis and sputum production.   Cardiovascular: Negative for chest pain, palpitations, orthopnea and claudication.  Gastrointestinal: Positive for abdominal pain, nausea and vomiting. Negative for diarrhea, heartburn and melena.  Genitourinary: Negative for dysuria, frequency and urgency.  Musculoskeletal: Negative for myalgias and neck pain.  Skin: Negative for itching and rash.  Neurological: Negative for dizziness, tingling, tremors, weakness and headaches.    Blood pressure 114/80, pulse  71, temperature 98.1 F (36.7 C), temperature source Oral, resp. rate 19, height _0  (1.575 m), weight 71.8 kg (158 lb 3 oz), last menstrual period 04/23/2016, SpO2 98 %. Physical Exam  Constitutional: She is oriented to person, place, and time. She appears well-developed and well-nourished. No distress.  HENT:  Head: Normocephalic and atraumatic.  Right Ear: External ear normal.  Left Ear: External ear normal.  Eyes: Conjunctivae and EOM are normal. Pupils are equal, round, and reactive to light.  Neck: Normal range of motion. Neck supple. No tracheal deviation present.  Cardiovascular: Normal rate and regular rhythm.  Exam reveals friction rub. Exam reveals no gallop.   No murmur heard. Respiratory: Effort normal and breath sounds normal. No stridor. No respiratory distress. She has no wheezes. She has no rales. She  exhibits no tenderness.  GI: Soft. Bowel sounds are normal. She exhibits no distension and no mass. There is tenderness (RUQ). There is no rebound and no guarding.  Musculoskeletal: Normal range of motion.  Neurological: She is alert and oriented to person, place, and time.  Skin: Skin is warm and dry. She is not diaphoretic.  Psychiatric: She has a normal mood and affect. Her behavior is normal.     Assessment/Plan 25 year old female with symptomatic cholelithiasis  1. Admit, nothing by mouth, IV antibiotics 2. The patient would benefit from a laparoscopic cholecystectomy with IOC. We'll discuss patient with Dr. Redmond Pulling. 3. Consent for OR lap chole with IOC  Reyes Ivan, MD 06/26/2016, 5:32 AM

## 2016-06-26 NOTE — Transfer of Care (Signed)
Immediate Anesthesia Transfer of Care Note  Patient: Sandra Shepard  Procedure(s) Performed: Procedure(s): LAPAROSCOPIC CHOLECYSTECTOMY WITH INTRAOPERATIVE CHOLANGIOGRAM (N/A)  Patient Location: PACU  Anesthesia Type:General  Level of Consciousness: awake, alert  and oriented  Airway & Oxygen Therapy: Patient Spontanous Breathing and Patient connected to nasal cannula oxygen  Post-op Assessment: Report given to RN, Post -op Vital signs reviewed and stable and Patient moving all extremities X 4  Post vital signs: Reviewed and stable  Last Vitals:  Vitals:   06/26/16 0915 06/26/16 1200  BP: 127/74 116/62  Pulse: 66 62  Resp:  20  Temp:  36.6 C    Last Pain:  Vitals:   06/26/16 1200  TempSrc: Oral  PainSc:          Complications: No apparent anesthesia complications

## 2016-06-26 NOTE — Anesthesia Procedure Notes (Signed)
Procedure Name: Intubation Date/Time: 06/26/2016 1:27 PM Performed by: Virgel GessHOLTZMAN, Mana Haberl LEFFEW Pre-anesthesia Checklist: Patient identified, Patient being monitored, Timeout performed, Emergency Drugs available and Suction available Patient Re-evaluated:Patient Re-evaluated prior to inductionOxygen Delivery Method: Circle System Utilized Preoxygenation: Pre-oxygenation with 100% oxygen Intubation Type: IV induction Ventilation: Mask ventilation without difficulty Laryngoscope Size: Mac and 3 Grade View: Grade I Tube type: Oral Tube size: 7.0 mm Number of attempts: 1 Airway Equipment and Method: Stylet Placement Confirmation: ETT inserted through vocal cords under direct vision,  positive ETCO2 and breath sounds checked- equal and bilateral Secured at: 22 cm Tube secured with: Tape Dental Injury: Teeth and Oropharynx as per pre-operative assessment

## 2016-06-26 NOTE — Interval H&P Note (Signed)
History and Physical Interval Note:  06/26/2016 1:08 PM  [name]  has presented today for surgery, with the diagnosis of Gallstones  The various methods of treatment have been discussed with the patient and family. After consideration of risks, benefits and other options for treatment, the patient has consented to  Procedure(s): LAPAROSCOPIC CHOLECYSTECTOMY WITH INTRAOPERATIVE CHOLANGIOGRAM (N/A) as a surgical intervention .  The patient's history has been reviewed, patient examined, no change in status, stable for surgery.  I have reviewed the patient's chart and labs.  Questions were answered to the patient's satisfaction.    Alert, nontoxic Soft, mild ttp, no guarding/rebound  I believe the patient's symptoms are consistent with gallbladder disease.  We discussed gallbladder disease.  I discussed laparoscopic cholecystectomy with IOC in detail.  The patient was shown diagrams detailing the procedure.  We discussed the risks and benefits of a laparoscopic cholecystectomy including, but not limited to bleeding, infection, injury to surrounding structures such as the intestine or liver, bile leak, retained gallstones, need to convert to an open procedure, prolonged diarrhea, blood clots such as  DVT, common bile duct injury, anesthesia risks, and possible need for additional procedures.  We discussed the typical post-operative recovery course. I explained that the likelihood of improvement of their symptoms is good.  Mary SellaEric M. Andrey CampanileWilson, MD, FACS General, Bariatric, & Minimally Invasive Surgery [facility] [place]   [facility] CenterWILSON,Sandra Minnifield M

## 2016-06-26 NOTE — ED Provider Notes (Signed)
TIME SEEN: during downtime  CHIEF COMPLAINT: abdominal pain  HPI:  Patient seen during unscheduled down time. The patient presents to the emergency department for evaluation of abdominal pain. She describes the pain as epigastric, RUQ and back pain. She had it since her pregnancy, she had a normal vaginal delivery on 04/27/2016. She was told the pain would likely go away after delivering her baby but it has gotten worse. She was seen for this last month and diagnosed with gastritis. She was given rx for omeprazole, Zofran, sucralfate and Vicodin. She took all the medications but it did not help and her pain continues to worsen. She is also having nausea and vomiting with it as well when her pain hurts. She has not had diarrhea, constipation, rectal bleeding or vaginal discharge. She is ending her period today. She presents today because her sister had to have her gallbladder removed and she is concerned that it may be more than gastritis. She reports currently being in 8/10 pain.   ROS: See HPI Constitutional: no fever  Eyes: no drainage  ENT: no runny nose   Cardiovascular:  no chest pain  Resp: no SOB  GI:  vomiting GU: no dysuria Integumentary: no rash  Allergy: no hives  Musculoskeletal: no leg swelling  Neurological: no slurred speech ROS otherwise negative  PAST MEDICAL HISTORY/PAST SURGICAL HISTORY:  History reviewed. No pertinent past medical history.  MEDICATIONS:  Prior to Admission medications   Medication Sig Start Date End Date Taking? Authorizing Provider  HYDROcodone-acetaminophen (NORCO/VICODIN) 5-325 MG tablet Take 2 tablets by mouth every 4 (four) hours as needed. 05/20/16   Rolland PorterMark James, MD  naproxen (NAPROSYN) 500 MG tablet Take 1 tablet (500 mg total) by mouth 2 (two) times daily. Patient not taking: Reported on 05/19/2016 05/12/15   Santiago GladHeather Laisure, PA-C  omeprazole (PRILOSEC) 20 MG capsule Take 1 capsule (20 mg total) by mouth 2 (two) times daily. 05/20/16   Rolland PorterMark James,  MD  ondansetron (ZOFRAN ODT) 4 MG disintegrating tablet Take 1 tablet (4 mg total) by mouth every 8 (eight) hours as needed for nausea. 05/20/16   Rolland PorterMark James, MD  sucralfate (CARAFATE) 1 g tablet Take 1 tablet (1 g total) by mouth 4 (four) times daily. 05/20/16   Rolland PorterMark James, MD    ALLERGIES:  No Known Allergies  SOCIAL HISTORY:  Social History  Substance Use Topics  . Smoking status: Never Smoker  . Smokeless tobacco: Never Used  . Alcohol use No    FAMILY HISTORY: History reviewed. No pertinent family history.  EXAM: BP 113/67 (BP Location: Left Arm)   Pulse (!) 56   Temp 98.1 F (36.7 C) (Oral)   Resp 19   Ht 5\' 2"  (1.575 m)   Wt 71.8 kg   LMP 04/23/2016   SpO2 100%   BMI 28.93 kg/m  CONSTITUTIONAL: Alert and oriented and responds appropriately to questions. Well-appearing; well-nourished HEAD: Normocephalic EYES: Conjunctivae clear, PERRL ENT: normal nose; no rhinorrhea; moist mucous membranes NECK: Supple, no meningismus, no LAD  CARD: RRR; S1 and S2 appreciated; no murmurs, no clicks, no rubs, no gallops RESP: Normal chest excursion without splinting or tachypnea; breath sounds clear and equal bilaterally; no wheezes, no rhonchi, no rales, no hypoxia or respiratory distress, speaking full sentences ABD/GI: Normal bowel sounds; non-distended; soft,, no rebound, no guarding, no peritoneal signs. The patient has tenderness to the epigastric and RUQ regions that is moderate. Negative Murphys sign. BACK:  The back appears normal and is non-tender to  palpation, there is no CVA tenderness EXT: Normal ROM in all joints; non-tender to palpation; no edema; normal capillary refill; no cyanosis, no calf tenderness or swelling    SKIN: Normal color for age and race; warm; no rash NEURO: Moves all extremities equally, sensation to light touch intact diffusely, cranial nerves II through XII intact PSYCH: The patient's mood and manner are appropriate. Grooming and personal hygiene are  appropriate.  Labwork is all normal- ordered abdominal US, 1 liter saline, 4 mg IV morphine,  IV Zofran.  Results for orders placed or performed during the hospital encounter of 06/25/16  Lipase, blood  Result Value Ref Range   Lipase 28 11 - 51 U/L  Comprehensive metabolic panel  Result Value Ref Range   Sodium 139 135 - 145 mmol/L   Potassium 3.8 3.5 - 5.1 mmol/L   Chloride 105 101 - 111 mmol/L   CO2 29 22 - 32 mmol/L   Glucose, Bld 99 65 - 99 mg/dL   BUN 7 6 - 20 mg/dL   Creatinine, Ser 1.61 0.44 - 1.00 mg/dL   Calcium 9.5 8.9 - 09.6 mg/dL   Total Protein 7.6 6.5 - 8.1 g/dL   Albumin 4.1 3.5 - 5.0 g/dL   AST 34 15 - 41 U/L   ALT 40 14 - 54 U/L   Alkaline Phosphatase 79 38 - 126 U/L   Total Bilirubin 0.5 0.3 - 1.2 mg/dL   GFR calc non Af Amer >60 >60 mL/min   GFR calc Af Amer >60 >60 mL/min   Anion gap 5 5 - 15  CBC  Result Value Ref Range   WBC 9.2 4.0 - 10.5 K/uL   RBC 4.39 3.87 - 5.11 MIL/uL   Hemoglobin 13.0 12.0 - 15.0 g/dL   HCT 04.5 40.9 - 81.1 %   MCV 91.1 78.0 - 100.0 fL   MCH 29.6 26.0 - 34.0 pg   MCHC 32.5 30.0 - 36.0 g/dL   RDW 91.4 78.2 - 95.6 %   Platelets 261 150 - 400 K/uL  Urinalysis, Routine w reflex microscopic  Result Value Ref Range   Color, Urine YELLOW YELLOW   APPearance CLOUDY (A) CLEAR   Specific Gravity, Urine 1.025 1.005 - 1.030   pH 7.5 5.0 - 8.0   Glucose, UA NEGATIVE NEGATIVE mg/dL   Hgb urine dipstick LARGE (A) NEGATIVE   Bilirubin Urine NEGATIVE NEGATIVE   Ketones, ur NEGATIVE NEGATIVE mg/dL   Protein, ur NEGATIVE NEGATIVE mg/dL   Nitrite NEGATIVE NEGATIVE   Leukocytes, UA MODERATE (A) NEGATIVE  Urine microscopic-add on  Result Value Ref Range   Squamous Epithelial / LPF 6-30 (A) NONE SEEN   WBC, UA 0-5 0 - 5 WBC/hpf   RBC / HPF 0-5 0 - 5 RBC/hpf   Bacteria, UA MANY (A) NONE SEEN   Urine-Other MUCOUS PRESENT   POC Urine Pregnancy, ED (do NOT order at [facility])  Result Value Ref Range   Preg Test, Ur NEGATIVE NEGATIVE   No  results found.  Abdominal US shows cholelithiasis with dilated bile duct at 1 cm. I spoke with Dr. Myrtie Neither who feels given the clinical presentation that the patient requires evaluation by a surgeon.  3:40 am- I spoke with the on call surgeon who is currently in the OR. If the patients pain is improving then he feels it is safe for her to be discharged with outpatient follow-up with [facility]. Otherwise, he will see her when he gets out of the OR. Patient currently  is pain free without palpation but still feels nauseous and has pain with palpation- unable to tolerate fluids.  5:19 - Dr. Ronalee Belts to touch base, he has been made aware that patient is still in the ED and was unable to tolerate fluids. He is going to come see patient.   Marlon Pel, PA-C 06/26/16 1610    Lavera Guise, MD 06/26/16 [phone]

## 2016-06-26 NOTE — Anesthesia Preprocedure Evaluation (Addendum)
Anesthesia Evaluation  Patient identified by MRN, date of birth, ID band Patient awake    Reviewed: Allergy & Precautions, NPO status , Patient's Chart, lab work & pertinent test results  History of Anesthesia Complications Negative for: history of anesthetic complications  Airway Mallampati: I   Neck ROM: Full    Dental  (+) Teeth Intact, Dental Advisory Given   Pulmonary neg pulmonary ROS,    breath sounds clear to auscultation       Cardiovascular negative cardio ROS   Rhythm:Regular     Neuro/Psych negative neurological ROS  negative psych ROS   GI/Hepatic Neg liver ROS, gallstones   Endo/Other  negative endocrine ROS  Renal/GU negative Renal ROS     Musculoskeletal negative musculoskeletal ROS (+)   Abdominal   Peds  Hematology negative hematology ROS (+)   Anesthesia Other Findings   Reproductive/Obstetrics                            Anesthesia Physical Anesthesia Plan  ASA: I  Anesthesia Plan: General   Post-op Pain Management:    Induction: Intravenous  Airway Management Planned: Oral ETT  Additional Equipment: None  Intra-op Plan:   Post-operative Plan: Extubation in OR  Informed Consent: I have reviewed the patients History and Physical, chart, labs and discussed the procedure including the risks, benefits and alternatives for the proposed anesthesia with the patient or authorized representative who has indicated his/her understanding and acceptance.   Dental advisory given  Plan Discussed with: CRNA and Surgeon  Anesthesia Plan Comments:         Anesthesia Quick Evaluation

## 2016-06-26 NOTE — Progress Notes (Signed)
Report give to maria rn as caregiver 

## 2016-06-27 ENCOUNTER — Encounter: Payer: Self-pay | Admitting: General Surgery

## 2016-06-27 ENCOUNTER — Encounter (HOSPITAL_COMMUNITY): Payer: Self-pay | Admitting: General Surgery

## 2016-06-27 MED ORDER — OXYCODONE HCL 5 MG PO TABS: 5.0000 mg | ORAL_TABLET | Freq: Four times a day (QID) | ORAL | 0 refills | Status: DC | PRN

## 2016-06-27 MED ORDER — HYDROCODONE-ACETAMINOPHEN 5-325 MG PO TABS: 1.0000 | ORAL_TABLET | Freq: Four times a day (QID) | ORAL | 0 refills | Status: DC | PRN

## 2016-06-27 MED ORDER — ONDANSETRON 4 MG PO TBDP: 4.0000 mg | ORAL_TABLET | Freq: Four times a day (QID) | ORAL | 0 refills | Status: DC | PRN

## 2016-06-27 MED ORDER — PROMETHAZINE HCL 25 MG/ML IJ SOLN
12.5000 mg | Freq: Four times a day (QID) | INTRAMUSCULAR | Status: DC | PRN
Start: 2016-06-27 — End: 2016-06-27
  Administered 2016-06-27: 12.5 mg via INTRAVENOUS
  Filled 2016-06-27: qty 1

## 2016-06-27 NOTE — Progress Notes (Signed)
Nausea relieved. Discharged home accompanied by family members.

## 2016-06-27 NOTE — Discharge Instructions (Signed)
Your appointment is at 11:30 AM on 07/10/2016, please arrive at least 30 min before your appointment to complete your check in paperwork.  If you are unable to arrive 30 min prior to your appointment time we may have to cancel or reschedule you.  LAPAROSCOPIC SURGERY: POST OP INSTRUCTIONS  1. DIET: Follow a light bland diet the first 24 hours after arrival home, such as soup, liquids, crackers, etc. Be sure to include lots of fluids daily. Avoid fast food or heavy meals as your are more likely to get nauseated. Eat a low fat the next few days after surgery.  2. Take your usually prescribed home medications unless otherwise directed. 3. PAIN CONTROL:  1. Pain is best controlled by a usual combination of three different methods TOGETHER:  1. Ice/Heat 2. Over the counter pain medication 3. Prescription pain medication 2. Most patients will experience some swelling and bruising around the incisions. Ice packs or heating pads (30-60 minutes up to 6 times a day) will help. Use ice for the first few days to help decrease swelling and bruising, then switch to heat to help relax tight/sore spots and speed recovery. Some people prefer to use ice alone, heat alone, alternating between ice & heat. Experiment to what works for you. Swelling and bruising can take several weeks to resolve.  3. It is helpful to take an over-the-counter pain medication regularly for the first few weeks. Choose one of the following that works best for you:  1. Naproxen (Aleve, etc) Two 220mg  tabs twice a day 2. Ibuprofen (Advil, etc) Three 200mg  tabs four times a day (every meal & bedtime) 3. Acetaminophen (Tylenol, etc) 500-650mg  four times a day (every meal & bedtime) 4. A prescription for pain medication (such as oxycodone, hydrocodone, etc) should be given to you upon discharge. Take your pain medication as prescribed.  1. If you are having problems/concerns with the prescription medicine (does not control pain, nausea, vomiting,  rash, itching, etc), please call us [phone] to see if we need to switch you to a different pain medicine that will work better for you and/or control your side effect better. 2. If you need a refill on your pain medication, please contact your pharmacy. They will contact our office to request authorization. Prescriptions will not be filled after 5 pm or on week-ends. 4. Avoid getting constipated. Between the surgery and the pain medications, it is common to experience some constipation. Increasing fluid intake and taking a fiber supplement (such as Metamucil, Citrucel, FiberCon, MiraLax, etc) 1-2 times a day regularly will usually help prevent this problem from occurring. A mild laxative (prune juice, Milk of Magnesia, MiraLax, etc) should be taken according to package directions if there are no bowel movements after 48 hours.  5. Watch out for diarrhea. If you have many loose bowel movements, simplify your diet to bland foods & liquids for a few days. Stop any stool softeners and decrease your fiber supplement. Switching to mild anti-diarrheal medications (Kayopectate, Pepto Bismol) can help. If this worsens or does not improve, please call us. 6. Wash / shower every day. You may shower over the dressings as they are waterproof. Continue to shower over incision(s) after the dressing is off. 7. Remove your waterproof bandages 5 days after surgery. You may leave the incision open to air. You may replace a dressing/Band-Aid to cover the incision for comfort if you wish.  8. ACTIVITIES as tolerated:  1. You may resume regular (light) daily activities beginning the  next day--such as daily self-care, walking, climbing stairs--gradually increasing activities as tolerated. If you can walk 30 minutes without difficulty, it is safe to try more intense activity such as jogging, treadmill, bicycling, low-impact aerobics, swimming, etc. 2. Save the most intensive and strenuous activity for last such as sit-ups,  heavy lifting, contact sports, etc Refrain from any heavy lifting or straining until you are off narcotics for pain control.  3. DO NOT PUSH THROUGH PAIN. Let pain be your guide: If it hurts to do something, don't do it. Pain is your body warning you to avoid that activity for another week until the pain goes down. 4. You may drive when you are no longer taking prescription pain medication, you can comfortably wear a seatbelt, and you can safely maneuver your car and apply brakes. 5. You may have sexual intercourse when it is comfortable.  9. FOLLOW UP in our office  1. Please call [facility] at [phone] to set up an appointment to see your surgeon in the office for a follow-up appointment approximately 2-3 weeks after your surgery. 2. Make sure that you call for this appointment the day you arrive home to insure a convenient appointment time.      10. IF YOU HAVE DISABILITY OR FAMILY LEAVE FORMS, BRING THEM TO THE               OFFICE FOR PROCESSING.   WHEN TO CALL us [phone]:  1. Poor pain control 2. Reactions / problems with new medications (rash/itching, nausea, etc)  3. Fever over 101.5 F (38.5 C) 4. Inability to urinate 5. Nausea and/or vomiting 6. Worsening swelling or bruising 7. Continued bleeding from incision. 8. Increased pain, redness, or drainage from the incision  The clinic staff is available to answer your questions during regular business hours (8:30am-5pm). Please dont hesitate to call and ask to speak to one of our nurses for clinical concerns.  If you have a medical emergency, go to the nearest emergency room or call 911.  A surgeon from [facility] is always on call at the [facility], [place]  [address], [place], [place] [address] ?  MAIN: [phone] ? TOLL FREE: [phone] ?  FAX [phone]  [email or web address]  Low-Fat Diet for Gallbladder Conditions A low-fat diet can be helpful  if you have pancreatitis or a gallbladder condition. With these conditions, your pancreas and gallbladder have trouble digesting fats. A healthy eating plan with less fat will help rest your pancreas and gallbladder and reduce your symptoms. WHAT DO I NEED TO KNOW ABOUT THIS DIET?  Eat a low-fat diet.  Reduce your fat intake to less than 20-30% of your total daily calories. This is less than 50-60 g of fat per day.  Remember that you need some fat in your diet. Ask your dietician what your daily goal should be.  Choose nonfat and low-fat healthy foods. Look for the words "nonfat," "low fat," or "fat free."  As a guide, look on the label and choose foods with less than 3 g of fat per serving. Eat only one serving.  Avoid alcohol.  Do not smoke. If you need help quitting, talk with your health care provider.  Eat small frequent meals instead of three large heavy meals. WHAT FOODS CAN I EAT? Grains Include healthy grains and starches such as potatoes, wheat bread, fiber-rich cereal, and brown rice. Choose whole grain options whenever possible. In adults, whole  grains should account for 45-65% of your daily calories.  Fruits and Vegetables Eat plenty of fruits and vegetables. Fresh fruits and vegetables add fiber to your diet. Meats and Other Protein Sources Eat lean meat such as chicken and pork. Trim any fat off of meat before cooking it. Eggs, fish, and beans are other sources of protein. In adults, these foods should account for 10-35% of your daily calories. Dairy Choose low-fat milk and dairy options. Dairy includes fat and protein, as well as calcium.  Fats and Oils Limit high-fat foods such as fried foods, sweets, baked goods, sugary drinks.  Other Creamy sauces and condiments, such as mayonnaise, can add extra fat. Think about whether or not you need to use them, or use smaller amounts or low fat options. WHAT FOODS ARE NOT RECOMMENDED?  High fat foods, such as:  [employer]  goods.  Ice cream.  [place] toast.  Sweet rolls.  Pizza.  Cheese bread.  Foods covered with batter, butter, creamy sauces, or cheese.  Fried foods.  Sugary drinks and desserts.  Foods that cause gas or bloating   This information is not intended to replace advice given to you by your health care provider. Make sure you discuss any questions you have with your health care provider.   Document Released: 11/09/2013 Document Reviewed: 11/09/2013 Elsevier Interactive Patient Education [employer]2016 Elsevier Inc.

## 2016-06-27 NOTE — Discharge Summary (Signed)
Physician Discharge Summary  Patient ID:  Sandra Shepard  MRN: [id number]  DOB/AGE: May 15, 1991 25 y.o.  Admit date: 06/25/2016 Discharge date: 06/27/2016  Discharge Diagnoses:   Active Problems:   Gallstones   Symptomatic cholelithiasis  Operation: Procedure(s):  LAPAROSCOPIC CHOLECYSTECTOMY WITH INTRAOPERATIVE CHOLANGIOGRAM on 06/26/2016 Andrey Campanile  Discharged Condition: good  Hospital Course: Sandra Shepard is an 25 y.o. female whose primary care physician is No PCP Per Patient and who was admitted 06/25/2016 with a chief complaint of  Chief Complaint  Patient presents with  . Abdominal Pain  .   She was brought to the operating room on 06/26/2016 and underwent  LAPAROSCOPIC CHOLECYSTECTOMY WITH INTRAOPERATIVE CHOLANGIOGRAM.  She is now one day post op.  She has had some nausea, but she needs to get up and move more. She is ready for discharge.  She has prescriptions for Vicodin. She has a note for her husband for being at the hospital yesterday and note to be out of work  Until 07/11/2016.  The discharge instructions were reviewed with the patient.  Consults: None  Significant Diagnostic Studies: Results for orders placed or performed during the hospital encounter of 06/25/16  Surgical pcr screen  Result Value Ref Range   MRSA, PCR NEGATIVE NEGATIVE   Staphylococcus aureus NEGATIVE NEGATIVE  Lipase, blood  Result Value Ref Range   Lipase 28 11 - 51 U/L  Comprehensive metabolic panel  Result Value Ref Range   Sodium 139 135 - 145 mmol/L   Potassium 3.8 3.5 - 5.1 mmol/L   Chloride 105 101 - 111 mmol/L   CO2 29 22 - 32 mmol/L   Glucose, Bld 99 65 - 99 mg/dL   BUN 7 6 - 20 mg/dL   Creatinine, Ser 4.09 0.44 - 1.00 mg/dL   Calcium 9.5 8.9 - 81.1 mg/dL   Total Protein 7.6 6.5 - 8.1 g/dL   Albumin 4.1 3.5 - 5.0 g/dL   AST 34 15 - 41 U/L   ALT 40 14 - 54 U/L   Alkaline Phosphatase 79 38 - 126 U/L   Total Bilirubin 0.5 0.3 - 1.2 mg/dL   GFR calc non Af Amer >60 >60 mL/min    GFR calc Af Amer >60 >60 mL/min   Anion gap 5 5 - 15  CBC  Result Value Ref Range   WBC 9.2 4.0 - 10.5 K/uL   RBC 4.39 3.87 - 5.11 MIL/uL   Hemoglobin 13.0 12.0 - 15.0 g/dL   HCT 91.4 78.2 - 95.6 %   MCV 91.1 78.0 - 100.0 fL   MCH 29.6 26.0 - 34.0 pg   MCHC 32.5 30.0 - 36.0 g/dL   RDW 21.3 08.6 - 57.8 %   Platelets 261 150 - 400 K/uL  Urinalysis, Routine w reflex microscopic  Result Value Ref Range   Color, Urine YELLOW YELLOW   APPearance CLOUDY (A) CLEAR   Specific Gravity, Urine 1.025 1.005 - 1.030   pH 7.5 5.0 - 8.0   Glucose, UA NEGATIVE NEGATIVE mg/dL   Hgb urine dipstick LARGE (A) NEGATIVE   Bilirubin Urine NEGATIVE NEGATIVE   Ketones, ur NEGATIVE NEGATIVE mg/dL   Protein, ur NEGATIVE NEGATIVE mg/dL   Nitrite NEGATIVE NEGATIVE   Leukocytes, UA MODERATE (A) NEGATIVE  Urine microscopic-add on  Result Value Ref Range   Squamous Epithelial / LPF 6-30 (A) NONE SEEN   WBC, UA 0-5 0 - 5 WBC/hpf   RBC / HPF 0-5 0 - 5 RBC/hpf   Bacteria, UA MANY (  A) NONE SEEN   Urine-Other MUCOUS PRESENT   POC Urine Pregnancy, ED (do NOT order at [facility])  Result Value Ref Range   Preg Test, Ur NEGATIVE NEGATIVE    Dg Cholangiogram Operative  Result Date: 06/26/2016 CLINICAL DATA:  25 year old female with a history of cholelithiasis EXAM: INTRAOPERATIVE CHOLANGIOGRAM TECHNIQUE: Cholangiographic images from the C-arm fluoroscopic device were submitted for interpretation post-operatively. Please see the procedural report for the amount of contrast and the fluoroscopy time utilized. COMPARISON:  Ultrasound 06/26/2016 FLUOROSCOPY TIME:  14 seconds FINDINGS: Surgical instruments project over the upper abdomen. There is cannulation of the cystic duct/gallbladder neck, with antegrade infusion of contrast. Caliber of the extrahepatic ductal system within normal limits. No large filling defect identified. Free flow of contrast across the ampulla. IMPRESSION: Intraoperative cholangiogram demonstrates  extrahepatic biliary ducts of unremarkable caliber, with no large filling defect identified. Free flow of contrast across the ampulla. Please refer to the dictated operative report for full details of intraoperative findings and procedure. Signed, Yvone Neu. Loreta Ave, DO [facility] Electronically Signed   By: Gilmer Mor D.O.   On: 06/26/2016 15:11   US Abdomen Complete  Result Date: 06/26/2016 CLINICAL DATA:  Initial evaluation for acute right upper quadrant abdominal pain. EXAM: ABDOMEN ULTRASOUND COMPLETE COMPARISON:  None. FINDINGS: Gallbladder: Several echogenic stones present within the gallbladder lumen, largest of which measured approximately 7 mm. Gallbladder wall measure within normal limits at up to 3 mm. No free pericholecystic fluid. No sonographic Murphy sign elicited on exam. Common bile duct: Diameter: Dilated up to 1 cm. No choledocholithiasis identified on this exam. Liver: No focal lesion identified. Within normal limits in parenchymal echogenicity. IVC: No abnormality visualized. Pancreas: Visualized portion unremarkable. Spleen: Size and appearance within normal limits. Right Kidney: Length: 10.3 cm. Echogenicity within normal limits. No mass or hydronephrosis visualized. Left Kidney: Length: 8.3 cm. Echogenicity within normal limits. No mass or hydronephrosis visualized. Abdominal aorta: No aneurysm visualized. Other findings: None. IMPRESSION: 1. Cholelithiasis without other sonographic features to suggest acute cholecystitis. 2. Dilatation of the common bile duct up to 1 cm. No choledocholithiasis identified on this exam. 3. Otherwise negative abdominal ultrasound. Electronically Signed   By: Rise Mu M.D.   On: 06/26/2016 02:08    Discharge Exam:  Vitals:   06/27/16 0121 06/27/16 0602  BP: 102/67 (!) 98/54  Pulse: 75 65  Resp: 16 17  Temp: 98.6 F (37 C) 98.8 F (37.1 C)    General: WN hispanic F who is  alert.  Lungs: Clear to auscultation and symmetric breath sounds. Heart:  RRR. No murmur or rub. Abdomen: Soft. No hernia. Normal bowel sounds. Incisions dressed.  Discharge Medications:     Medication List    TAKE these medications   HYDROcodone-acetaminophen 5-325 MG tablet Commonly known as:  NORCO/VICODIN Take 1-2 tablets by mouth every 6 (six) hours as needed. What changed:  how much to take  when to take this   omeprazole 20 MG capsule Commonly known as:  PRILOSEC Take 1 capsule (20 mg total) by mouth 2 (two) times daily.   ondansetron 4 MG disintegrating tablet Commonly known as:  ZOFRAN ODT Take 1 tablet (4 mg total) by mouth every 8 (eight) hours as needed for nausea.   oxyCODONE 5 MG immediate release tablet Commonly known as:  Oxy IR/ROXICODONE Take 1-2 tablets (5-10 mg total) by mouth every 6 (six) hours as needed for moderate pain.   sucralfate 1 g tablet Commonly  known as:  CARAFATE Take 1 tablet (1 g total) by mouth 4 (four) times daily.       Disposition: 01-Home or Self Care  Discharge Instructions    Diet - low sodium heart healthy    Complete by:  As directed   Increase activity slowly    Complete by:  As directed      Follow-up Information    [facility].   Specialty:  General Surgery Why:  your appointment for post-operative follow-up is at 11:30 AM. Please arrive 30 minutes early to get checked in and fill out any neccessary paperwork.  Contact information: [address] [place] [phone]         Your appointment is at 11:30 AM on 07/10/2016, please arrive at least 30 min before your appointment to complete your check in paperwork.   Signed: Ovidio Kinavid Serapio Edelson, M.D., [facility] Office:  [phone]  06/27/2016, 10:02 AM

## 2016-06-28 NOTE — Anesthesia Postprocedure Evaluation (Signed)
Anesthesia Post Note  Patient: Sandra Shepard  Procedure(s) Performed: Procedure(s) (LRB): LAPAROSCOPIC CHOLECYSTECTOMY WITH INTRAOPERATIVE CHOLANGIOGRAM (N/A)  Patient location during evaluation: PACU Anesthesia Type: General Level of consciousness: awake Pain management: pain level controlled Vital Signs Assessment: post-procedure vital signs reviewed and stable Respiratory status: spontaneous breathing Postop Assessment: no signs of nausea or vomiting Anesthetic complications: no    Last Vitals:  Vitals:   06/27/16 0602 06/27/16 1340  BP: (!) 98/54 105/71  Pulse: 65 73  Resp: 17   Temp: 37.1 C 36.8 C    Last Pain:  Vitals:   06/27/16 1718  TempSrc:   PainSc: 0-No pain                 Allycia Pitz

## 2018-07-07 ENCOUNTER — Encounter (HOSPITAL_COMMUNITY): Payer: Self-pay | Admitting: Emergency Medicine

## 2018-07-07 ENCOUNTER — Inpatient Hospital Stay (HOSPITAL_COMMUNITY)
Admission: EM | Admit: 2018-07-07 | Discharge: 2018-07-12 | DRG: 167 | Disposition: A | Discharge status: Home / Self Care | Payer: BLUE CROSS/BLUE SHIELD | Attending: Internal Medicine | Admitting: Internal Medicine

## 2018-07-07 DIAGNOSIS — Z6830 Body mass index (BMI) 30.0-30.9, adult: Secondary | ICD-10-CM

## 2018-07-07 DIAGNOSIS — R0902 Hypoxemia: Secondary | ICD-10-CM | POA: Diagnosis present

## 2018-07-07 DIAGNOSIS — R918 Other nonspecific abnormal finding of lung field: Secondary | ICD-10-CM | POA: Diagnosis present

## 2018-07-07 DIAGNOSIS — Z833 Family history of diabetes mellitus: Secondary | ICD-10-CM

## 2018-07-07 DIAGNOSIS — J9600 Acute respiratory failure, unspecified whether with hypoxia or hypercapnia: Secondary | ICD-10-CM

## 2018-07-07 DIAGNOSIS — B999 Unspecified infectious disease: Secondary | ICD-10-CM | POA: Diagnosis present

## 2018-07-07 DIAGNOSIS — Z791 Long term (current) use of non-steroidal anti-inflammatories (NSAID): Secondary | ICD-10-CM

## 2018-07-07 DIAGNOSIS — J679 Hypersensitivity pneumonitis due to unspecified organic dust: Secondary | ICD-10-CM | POA: Diagnosis present

## 2018-07-07 DIAGNOSIS — R05 Cough: Secondary | ICD-10-CM | POA: Diagnosis not present

## 2018-07-07 DIAGNOSIS — J95811 Postprocedural pneumothorax: Secondary | ICD-10-CM | POA: Diagnosis not present

## 2018-07-07 DIAGNOSIS — J939 Pneumothorax, unspecified: Secondary | ICD-10-CM

## 2018-07-07 DIAGNOSIS — J189 Pneumonia, unspecified organism: Secondary | ICD-10-CM | POA: Diagnosis present

## 2018-07-07 DIAGNOSIS — Z9049 Acquired absence of other specified parts of digestive tract: Secondary | ICD-10-CM

## 2018-07-07 DIAGNOSIS — Z9889 Other specified postprocedural states: Secondary | ICD-10-CM

## 2018-07-07 DIAGNOSIS — J849 Interstitial pulmonary disease, unspecified: Secondary | ICD-10-CM | POA: Diagnosis present

## 2018-07-07 DIAGNOSIS — R059 Cough, unspecified: Secondary | ICD-10-CM | POA: Diagnosis present

## 2018-07-07 DIAGNOSIS — E66811 Obesity, class 1: Secondary | ICD-10-CM | POA: Diagnosis present

## 2018-07-07 DIAGNOSIS — E669 Obesity, unspecified: Secondary | ICD-10-CM | POA: Diagnosis present

## 2018-07-07 NOTE — ED Triage Notes (Signed)
Patient reports dry cough , fatigue , mild nausea and headache onset last week .

## 2018-07-08 ENCOUNTER — Other Ambulatory Visit: Payer: Self-pay

## 2018-07-08 ENCOUNTER — Emergency Department (HOSPITAL_COMMUNITY): Payer: BLUE CROSS/BLUE SHIELD

## 2018-07-08 ENCOUNTER — Encounter (HOSPITAL_COMMUNITY): Payer: Self-pay | Admitting: Internal Medicine

## 2018-07-08 DIAGNOSIS — R059 Cough, unspecified: Secondary | ICD-10-CM | POA: Diagnosis present

## 2018-07-08 DIAGNOSIS — J849 Interstitial pulmonary disease, unspecified: Secondary | ICD-10-CM | POA: Diagnosis present

## 2018-07-08 DIAGNOSIS — Z9049 Acquired absence of other specified parts of digestive tract: Secondary | ICD-10-CM | POA: Diagnosis not present

## 2018-07-08 DIAGNOSIS — J679 Hypersensitivity pneumonitis due to unspecified organic dust: Secondary | ICD-10-CM | POA: Diagnosis present

## 2018-07-08 DIAGNOSIS — E669 Obesity, unspecified: Secondary | ICD-10-CM | POA: Diagnosis present

## 2018-07-08 DIAGNOSIS — Z791 Long term (current) use of non-steroidal anti-inflammatories (NSAID): Secondary | ICD-10-CM | POA: Diagnosis not present

## 2018-07-08 DIAGNOSIS — B999 Unspecified infectious disease: Secondary | ICD-10-CM | POA: Diagnosis present

## 2018-07-08 DIAGNOSIS — Z6830 Body mass index (BMI) 30.0-30.9, adult: Secondary | ICD-10-CM | POA: Diagnosis not present

## 2018-07-08 DIAGNOSIS — R05 Cough: Principal | ICD-10-CM

## 2018-07-08 DIAGNOSIS — J95811 Postprocedural pneumothorax: Secondary | ICD-10-CM | POA: Diagnosis not present

## 2018-07-08 DIAGNOSIS — R0902 Hypoxemia: Secondary | ICD-10-CM | POA: Diagnosis present

## 2018-07-08 DIAGNOSIS — R918 Other nonspecific abnormal finding of lung field: Secondary | ICD-10-CM | POA: Diagnosis present

## 2018-07-08 DIAGNOSIS — Z833 Family history of diabetes mellitus: Secondary | ICD-10-CM | POA: Diagnosis not present

## 2018-07-08 LAB — I-STAT BETA HCG BLOOD, ED (MC, WL, AP ONLY)

## 2018-07-08 LAB — RESPIRATORY PANEL BY PCR
ADENOVIRUS-RVPPCR: NOT DETECTED
Bordetella pertussis: NOT DETECTED
CHLAMYDOPHILA PNEUMONIAE-RVPPCR: NOT DETECTED
CORONAVIRUS HKU1-RVPPCR: NOT DETECTED
CORONAVIRUS NL63-RVPPCR: NOT DETECTED
CORONAVIRUS OC43-RVPPCR: NOT DETECTED
Coronavirus 229E: NOT DETECTED
Influenza A: NOT DETECTED
Influenza B: NOT DETECTED
Metapneumovirus: NOT DETECTED
Mycoplasma pneumoniae: NOT DETECTED
PARAINFLUENZA VIRUS 1-RVPPCR: NOT DETECTED
Parainfluenza Virus 2: NOT DETECTED
Parainfluenza Virus 3: NOT DETECTED
Parainfluenza Virus 4: NOT DETECTED
RHINOVIRUS / ENTEROVIRUS - RVPPCR: NOT DETECTED
Respiratory Syncytial Virus: NOT DETECTED

## 2018-07-08 LAB — CBC
HCT: 41.6 % (ref 36.0–46.0)
Hemoglobin: 13.3 g/dL (ref 12.0–15.0)
MCH: 29.2 pg (ref 26.0–34.0)
MCHC: 32 g/dL (ref 30.0–36.0)
MCV: 91.4 fL (ref 78.0–100.0)
PLATELETS: 235 10*3/uL (ref 150–400)
RBC: 4.55 MIL/uL (ref 3.87–5.11)
RDW: 12.5 % (ref 11.5–15.5)
WBC: 8.8 10*3/uL (ref 4.0–10.5)

## 2018-07-08 LAB — SEDIMENTATION RATE: SED RATE: 58 mm/h — AB (ref 0–22)

## 2018-07-08 LAB — BASIC METABOLIC PANEL
ANION GAP: 9 (ref 5–15)
BUN: 9 mg/dL (ref 6–20)
CO2: 25 mmol/L (ref 22–32)
Calcium: 8.9 mg/dL (ref 8.9–10.3)
Chloride: 107 mmol/L (ref 98–111)
Creatinine, Ser: 0.65 mg/dL (ref 0.44–1.00)
Glucose, Bld: 99 mg/dL (ref 70–99)
POTASSIUM: 4.1 mmol/L (ref 3.5–5.1)
Sodium: 141 mmol/L (ref 135–145)

## 2018-07-08 LAB — I-STAT CG4 LACTIC ACID, ED: Lactic Acid, Venous: 1.21 mmol/L (ref 0.5–1.9)

## 2018-07-08 LAB — RAPID HIV SCREEN (HIV 1/2 AB+AG)
HIV 1/2 ANTIBODIES: NONREACTIVE
HIV-1 P24 ANTIGEN - HIV24: NONREACTIVE

## 2018-07-08 LAB — C-REACTIVE PROTEIN: CRP: 9.8 mg/dL — ABNORMAL HIGH (ref <1.0)

## 2018-07-08 MED ORDER — ACETAMINOPHEN 325 MG PO TABS
650.0000 mg | ORAL_TABLET | Freq: Four times a day (QID) | ORAL | Status: DC | PRN
  Administered 2018-07-08: 650 mg via ORAL
  Filled 2018-07-08 (×3): qty 2

## 2018-07-08 MED ORDER — LEVOFLOXACIN IN D5W 500 MG/100ML IV SOLN
500.0000 mg | Freq: Once | INTRAVENOUS | Status: AC
  Administered 2018-07-08: 500 mg via INTRAVENOUS
  Filled 2018-07-08: qty 100

## 2018-07-08 MED ORDER — LACTATED RINGERS IV SOLN
INTRAVENOUS | Status: DC
  Administered 2018-07-08: 10:00:00 via INTRAVENOUS

## 2018-07-08 MED ORDER — SODIUM CHLORIDE 0.9 % IV SOLN
1.0000 g | INTRAVENOUS | Status: DC
  Administered 2018-07-08 – 2018-07-11 (×3): 1 g via INTRAVENOUS
  Filled 2018-07-08 (×5): qty 10

## 2018-07-08 MED ORDER — ONDANSETRON HCL 4 MG PO TABS: 4.0000 mg | ORAL_TABLET | Freq: Four times a day (QID) | ORAL | Status: DC | PRN

## 2018-07-08 MED ORDER — ENOXAPARIN SODIUM 40 MG/0.4ML ~~LOC~~ SOLN
40.0000 mg | SUBCUTANEOUS | Status: DC
  Administered 2018-07-08 – 2018-07-11 (×4): 40 mg via SUBCUTANEOUS
  Filled 2018-07-08 (×5): qty 0.4

## 2018-07-08 MED ORDER — IBUPROFEN 200 MG PO TABS
200.0000 mg | ORAL_TABLET | Freq: Four times a day (QID) | ORAL | Status: DC | PRN
  Administered 2018-07-10 – 2018-07-11 (×2): 200 mg via ORAL
  Filled 2018-07-08 (×2): qty 1

## 2018-07-08 MED ORDER — ONDANSETRON HCL 4 MG/2ML IJ SOLN: 4.0000 mg | Freq: Four times a day (QID) | INTRAMUSCULAR | Status: DC | PRN

## 2018-07-08 MED ORDER — ACETAMINOPHEN 650 MG RE SUPP: 650.0000 mg | Freq: Four times a day (QID) | RECTAL | Status: DC | PRN

## 2018-07-08 MED ORDER — SODIUM CHLORIDE 0.9 % IV SOLN
500.0000 mg | INTRAVENOUS | Status: DC
  Administered 2018-07-09 – 2018-07-11 (×3): 500 mg via INTRAVENOUS
  Filled 2018-07-08 (×3): qty 500

## 2018-07-08 MED ORDER — IOPAMIDOL (ISOVUE-300) INJECTION 61%: 100.0000 mL | Freq: Once | INTRAVENOUS | Status: DC | PRN

## 2018-07-08 MED ORDER — ONDANSETRON 4 MG PO TBDP
4.0000 mg | ORAL_TABLET | Freq: Once | ORAL | Status: AC
  Administered 2018-07-08: 4 mg via ORAL
  Filled 2018-07-08: qty 1

## 2018-07-08 MED ORDER — IOHEXOL 300 MG/ML  SOLN
75.0000 mL | Freq: Once | INTRAMUSCULAR | Status: AC | PRN
  Administered 2018-07-08: 75 mL via INTRAVENOUS

## 2018-07-08 NOTE — ED Notes (Signed)
Admitting at bedside 

## 2018-07-08 NOTE — Consult Note (Signed)
Name: Sandra Shepard MRN: [id number] DOB: [birth year]    ADMISSION DATE:  07/07/2018 CONSULTATION DATE:  07/08/2018  REFERRING MD :  Dr. Ophelia CharterYates  CHIEF COMPLAINT:  Lung nodules  HISTORY OF PRESENT ILLNESS:   27 year old Hispanic, English speaking female with no significant PMH presenting for nausea and non productive cough, and intermittent headaches since Friday.  Reports subjective fevers, last one on Monday.   Patient is originally from [place] and has been in [place] for over 20 years and has not visited since.  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  She lives at home with her husband and three children.   Denies pets, bird exposures, recent travel, other sick contacts in the home, chills, night sweats, joint pains, rash, or dysphagia.  Denies known mold exposures, jail time or TB exposure.  Had childhood vaccinations that she knows of.  No known significant family history.  Never smoker or second-hand exposure, no ETOH use, or drug use, specifically IVDA.   In the ED, patient had unremarkable labs, hemodynamically stable and saturations > 94% on room air.  CXR was abnormal showing multiple bilateral nodular opacities therefore a CT chest was performed which showed numerable, mainly subcentimeter bilaterally, with a dominant nodule in the RUL.  She was placed on airborne precautions and [facility].  PCCM consulted for further recommendations and testing.   PAST MEDICAL HISTORY :   has no past medical history on file.  has a past surgical history that includes Cholecystectomy (N/A, 06/26/2016).   Prior to Admission medications   Medication Sig Start Date End Date Taking? Authorizing Provider  ibuprofen (ADVIL,MOTRIN) 200 MG tablet Take 200 mg by mouth every 6 (six) hours as needed for moderate pain.   Yes [provider]   No Known  Allergies  FAMILY HISTORY:  family history includes Diabetes in her mother. SOCIAL HISTORY:  reports that she has never smoked. She has never used smokeless tobacco. She reports that she does not drink alcohol or use drugs.  REVIEW OF SYSTEMS:   Per HPI, otherwise negative.   SUBJECTIVE:   VITAL SIGNS: Temp:  [98.6 F (37 C)-99.2 F (37.3 C)] 98.6 F (37 C) (08/21 0349) Pulse Rate:  [75-96] 75 (08/21 1315) Resp:  [14-25] 19 (08/21 1315) BP: (98-137)/(63-122) 105/69 (08/21 1315) SpO2:  [93 %-98 %] 95 % (08/21 1315)  PHYSICAL EXAMINATION: General:  Young, well nourished female lying in ER stretcher in NAD HEENT: MM pink/moist, pupils anicteric  Neuro: Alert/ oriented, MAE CV: ST, no m/r/g PULM: even/non-labored, lungs bilaterally clear  GI: soft, +BS Extremities: warm/dry Skin: no rashes   Recent Labs  Lab 07/08/18 0011  NA 141  K 4.1  CL 107  CO2 25  BUN 9  CREATININE 0.65  GLUCOSE 99   Recent Labs  Lab 07/08/18 0011  HGB 13.3  HCT 41.6  WBC 8.8  PLT 235   Dg Chest 2 View  Result Date: 07/08/2018 CLINICAL DATA:  Dry cough EXAM: CHEST - 2 VIEW COMPARISON:  None. FINDINGS: No pleural effusion. Normal heart size. Multiple bilateral nodular opacities. Possible 2.1 cm right upper lobe nodule. No pneumothorax. IMPRESSION: Multiple bilateral nodular airspace disease, could be secondary to diffuse atypical infection or possible pulmonary nodules. CT chest recommended to further evaluate. Electronically Signed   By: Jasmine PangKim  Fujinaga M.D.   On:  07/08/2018 00:31   Ct Chest W Contrast  Result Date: 07/08/2018 CLINICAL DATA:  Cough. Central chest pain. Diffuse pulmonary nodules on radiograph. EXAM: CT CHEST WITH CONTRAST TECHNIQUE: Multidetector CT imaging of the chest was performed during intravenous contrast administration. CONTRAST:  75mL OMNIPAQUE IOHEXOL 300 MG/ML  SOLN COMPARISON:  Chest radiograph earlier this day. FINDINGS: Cardiovascular: Normal heart size. No  pericardial effusion. Normal thoracic aorta. Conventional branching pattern from the aortic arch. Mediastinum/Nodes: 10 mm AP window node. No other mediastinal or hilar adenopathy. Soft tissue density in the anterior mediastinum may represent residual or recurrent thymus. The esophagus is decompressed. No thyroid nodule. Lungs/Pleura: Innumerable noncalcified nodules throughout all lobes of both lungs, majority are subcentimeter in size. There is a dominant low-density nodule in the right upper lobe measuring 2.1 x 2.3 cm with peripheral air bronchograms. There are also prominent nodules in the left lung, 1.4 x 1.3 cm in the left lower lobe, as well as 1.3 x 1.3 cm lingular nodule. No pleural effusion or pulmonary edema. Trachea mainstem bronchi are patent. Upper Abdomen: No acute findings.  Prior cholecystectomy. Musculoskeletal: There are no acute or suspicious osseous abnormalities. IMPRESSION: 1. Innumerable pulmonary nodules throughout both lungs, majority subcentimeter, however dominant nodule in the right upper lobe measures 2.3 x 2.2 cm. Etiology is indeterminate, favor atypical infection such as fungal, additional etiologies such as inflammatory or underlying neoplastic processes are also considered. Consider pulmonary consultation. Radiographic follow-up recommended. 2. Prominent AP window node is likely reactive. Electronically Signed   By: Rubye OaksMelanie  Ehinger M.D.   On: 07/08/2018 03:13    SIGNIFICANT EVENTS  8/21 Admit  STUDIES:  8/21 CXR >>  Multiple bilateral nodular airspace disease, could be secondary to diffuse atypical infection or possible pulmonary nodules. CT chest recommended to further evaluate  8/21 CT chest >> 1. Innumerable pulmonary nodules throughout both lungs, majority subcentimeter, however dominant nodule in the right upper lobe measures 2.3 x 2.2 cm. Etiology is indeterminate, favor atypical infection such as fungal, additional etiologies such as inflammatory or underlying  neoplastic processes are also considered. Consider pulmonary consultation.  Radiographic follow-up recommended. 2. Prominent AP window node is likely reactive.  CULTURES:  8/21 BC X 2 >> 8/21 Resp viral panel   Discussion:  [age] yoF with 4 day hx of dry cough, nausea, headache and subjective fever found to have multiple bilateral nodular lung opacities  ASSESSMENT / PLAN:  Bilateral Lung Nodules Dry cough - history provided by patient does not help clarify etiology of nodules - ddx is broad including infectious vs inflammatory vs fungal vs CTD vs less likely malignancy   P:  - although afebrile and normal WBC, would start empiric abx coverage with azithro/ ceftriaxone - pending PCT level - trend WBC/ fever curve - sending for autoimmune labs, HSP, and RVP  - send for urine legionella/ strep - pending HIV and Quant Gold  - continue droplet precautions - patient has not had any productive cough, doubt will be able to obtain sputum to r/o TB, therefore, bronchoscopy set up for Friday, 8/23 at 1300 with Dr. Isaiah SergeMannam.  Pt will need to be NPO after midnight prior to that.    Posey BoyerBrooke Simpson, AGACNP-BC [facility] Pgr: [phone] or if no answer [phone] 07/08/2018, 2:27 PM

## 2018-07-08 NOTE — Plan of Care (Signed)
°  Problem: Activity: °Goal: Risk for activity intolerance will decrease °Outcome: Progressing °  °Problem: Nutrition: °Goal: Adequate nutrition will be maintained °Outcome: Progressing °  °Problem: Elimination: °Goal: Will not experience complications related to bowel motility °Outcome: Progressing °  °Problem: Safety: °Goal: Ability to remain free from injury will improve °Outcome: Progressing °  °

## 2018-07-08 NOTE — ED Provider Notes (Addendum)
Assumed care from PA Schrosbee at shift change.  See prior notes for full H&P.  Briefly, 27 y.o. F here with few days of cough, headache, fatigue, subjective fevers. Has grossly abnormal CXR here, labs reassuring.  Plan:  CT chest pending for further evaluation.  Results for orders placed or performed during the hospital encounter of 07/07/18  CBC  Result Value Ref Range   WBC 8.8 4.0 - 10.5 K/uL   RBC 4.55 3.87 - 5.11 MIL/uL   Hemoglobin 13.3 12.0 - 15.0 g/dL   HCT 04.541.6 40.936.0 - 81.146.0 %   MCV 91.4 78.0 - 100.0 fL   MCH 29.2 26.0 - 34.0 pg   MCHC 32.0 30.0 - 36.0 g/dL   RDW 91.412.5 78.211.5 - 95.615.5 %   Platelets 235 150 - 400 K/uL  Basic metabolic panel  Result Value Ref Range   Sodium 141 135 - 145 mmol/L   Potassium 4.1 3.5 - 5.1 mmol/L   Chloride 107 98 - 111 mmol/L   CO2 25 22 - 32 mmol/L   Glucose, Bld 99 70 - 99 mg/dL   BUN 9 6 - 20 mg/dL   Creatinine, Ser 2.130.65 0.44 - 1.00 mg/dL   Calcium 8.9 8.9 - 08.610.3 mg/dL   GFR calc non Af Amer >60 >60 mL/min   GFR calc Af Amer >60 >60 mL/min   Anion gap 9 5 - 15  I-Stat Beta hCG blood, ED ([facility], [facility], [facility] only)  Result Value Ref Range   I-stat hCG, quantitative <5.0 <5 mIU/mL   Comment 3           Dg Chest 2 View  Result Date: 07/08/2018 CLINICAL DATA:  Dry cough EXAM: CHEST - 2 VIEW COMPARISON:  None. FINDINGS: No pleural effusion. Normal heart size. Multiple bilateral nodular opacities. Possible 2.1 cm right upper lobe nodule. No pneumothorax. IMPRESSION: Multiple bilateral nodular airspace disease, could be secondary to diffuse atypical infection or possible pulmonary nodules. CT chest recommended to further evaluate. Electronically Signed   By: Jasmine PangKim  Fujinaga M.D.   On: 07/08/2018 00:31   Ct Chest W Contrast  Result Date: 07/08/2018 CLINICAL DATA:  Cough. Central chest pain. Diffuse pulmonary nodules on radiograph. EXAM: CT CHEST WITH CONTRAST TECHNIQUE: Multidetector CT imaging of the chest was performed during intravenous contrast  administration. CONTRAST:  75mL OMNIPAQUE IOHEXOL 300 MG/ML  SOLN COMPARISON:  Chest radiograph earlier this day. FINDINGS: Cardiovascular: Normal heart size. No pericardial effusion. Normal thoracic aorta. Conventional branching pattern from the aortic arch. Mediastinum/Nodes: 10 mm AP window node. No other mediastinal or hilar adenopathy. Soft tissue density in the anterior mediastinum may represent residual or recurrent thymus. The esophagus is decompressed. No thyroid nodule. Lungs/Pleura: Innumerable noncalcified nodules throughout all lobes of both lungs, majority are subcentimeter in size. There is a dominant low-density nodule in the right upper lobe measuring 2.1 x 2.3 cm with peripheral air bronchograms. There are also prominent nodules in the left lung, 1.4 x 1.3 cm in the left lower lobe, as well as 1.3 x 1.3 cm lingular nodule. No pleural effusion or pulmonary edema. Trachea mainstem bronchi are patent. Upper Abdomen: No acute findings.  Prior cholecystectomy. Musculoskeletal: There are no acute or suspicious osseous abnormalities. IMPRESSION: 1. Innumerable pulmonary nodules throughout both lungs, majority subcentimeter, however dominant nodule in the right upper lobe measures 2.3 x 2.2 cm. Etiology is indeterminate, favor atypical infection such as fungal, additional etiologies such as inflammatory or underlying neoplastic processes are also considered. Consider pulmonary consultation. Radiographic  follow-up recommended. 2. Prominent AP window node is likely reactive. Electronically Signed   By: Rubye OaksMelanie  Ehinger M.D.   On: 07/08/2018 03:13     CT with diffuse atypical disease of unknown etiology, questionably fungal.  Patient without recent travel outside the country, no hx of IVDU or cancer.  No sick contacts.  Denies hx of pulmonary illnesses in the past.  No hemoptysis, night time sweats, or weight loss.  Discussed with pulmonology, Dr. Belia HemanKasa-- recommendation for admission, r/o TB and evaluation  for atypical infection.  Have ordered sputum cultures, quantiferron, HIV, blood cultures.  Will place on droplet precautions.  Ordered dose of IV levaquin given likely atypical nature.  Discussed with Dr. Julian ReilGardner-- morning team will admit for ongoing care.  Further abx based on their assessment/additional lab results.   Garlon HatchetSanders, Evanie Buckle M, PA-C 07/08/18 0540    Garlon HatchetSanders, Janeann Paisley M, PA-C 07/08/18 0540    Clarene DukeLittle, Ambrose Finlandachel Morgan, MD 07/08/18 [phone]

## 2018-07-08 NOTE — ED Notes (Signed)
Pharmacist notified on pt.'s Levaquin IV order.

## 2018-07-08 NOTE — ED Notes (Signed)
Patient currently at CT scan .  

## 2018-07-08 NOTE — ED Notes (Signed)
Patient transported to CT 

## 2018-07-08 NOTE — ED Provider Notes (Signed)
[name]   CSN: [id number] Arrival date & time: 07/07/18  2109     History   Chief Complaint Chief Complaint  Patient presents with  . Cough    HPI Sandra Shepard is a 27 y.o. female.  HPI   Sandra Shepard is a 27 year old female with a history of cholecystectomy who presents to the emergency department for evaluation of nausea, cough, tactile fevers and intermittent headaches.  Patient reports that her symptoms began 4 days ago.  She reports that she is constantly nauseated, worse immediately after eating.  No known food triggers.  She has not vomited.  She also states that she has felt intermittently hot and cold.  Has a dry cough, denies associated shortness of breath or chest pain.  She reports that she has had some intermittent headaches, but denies this currently.  She has been taking ibuprofen which helps with the headaches.  She took a home pregnancy test earlier this week which was negative.  She denies neck stiffness, sore throat, congestion, ear pain, sinus pain, abdominal pain, dysuria or urinary frequency, rashes.  No sick contacts with similar symptoms.  She denies travel outside of the country.  Denies outdoor travel in the woods or bug bites.  Past Medical History:  Diagnosis Date  . Cholecystitis 06/2016    Patient Active Problem List   Diagnosis Date Noted  . Gallstones 06/26/2016  . Symptomatic cholelithiasis 06/26/2016    Past Surgical History:  Procedure Laterality Date  . CHOLECYSTECTOMY  06/26/2016  . CHOLECYSTECTOMY N/A 06/26/2016   Procedure: LAPAROSCOPIC CHOLECYSTECTOMY WITH INTRAOPERATIVE CHOLANGIOGRAM;  Surgeon: Gaynelle AduEric [name], MD;  [facility];  Service: General;  Laterality: N/A;     OB History   None      Home Medications    Prior to Admission medications   Medication Sig Start Date End Date Taking? Authorizing Provider  HYDROcodone-acetaminophen (NORCO/VICODIN) 5-325 MG tablet  Take 1-2 tablets by mouth every 6 (six) hours as needed. 06/27/16   Ovidio KinNewman, David, MD  omeprazole (PRILOSEC) 20 MG capsule Take 1 capsule (20 mg total) by mouth 2 (two) times daily. 05/20/16   Rolland PorterJames, Mark, MD  ondansetron (ZOFRAN ODT) 4 MG disintegrating tablet Take 1 tablet (4 mg total) by mouth every 8 (eight) hours as needed for nausea. 05/20/16   Rolland PorterJames, Mark, MD  ondansetron (ZOFRAN-ODT) 4 MG disintegrating tablet Take 1 tablet (4 mg total) by mouth every 6 (six) hours as needed for nausea. 06/27/16   Adam PhenixSimaan, Elizabeth S, PA-C  oxyCODONE (OXY IR/ROXICODONE) 5 MG immediate release tablet Take 1-2 tablets (5-10 mg total) by mouth every 6 (six) hours as needed for moderate pain. 06/27/16   Adam PhenixSimaan, Elizabeth S, PA-C  sucralfate (CARAFATE) 1 g tablet Take 1 tablet (1 g total) by mouth 4 (four) times daily. 05/20/16   Rolland PorterJames, Mark, MD    Family History No family history on file.  Social History Social History   Tobacco Use  . Smoking status: Never Smoker  . Smokeless tobacco: Never Used  Substance Use Topics  . Alcohol use: No  . Drug use: No     Allergies   Patient has no known allergies.   Review of Systems Review of Systems  Constitutional: Positive for chills and fever.  HENT: Negative for congestion, ear pain, rhinorrhea, sinus pressure, sore throat and trouble swallowing.   Respiratory: Positive for cough. Negative for shortness of breath and wheezing.   Cardiovascular: Negative for chest pain.  Gastrointestinal: Positive for nausea. Negative for abdominal pain, diarrhea and vomiting.  Genitourinary: Negative for dysuria and frequency.  Musculoskeletal: Negative for arthralgias and neck pain.  Skin: Negative for rash.  Neurological: Positive for headaches. Negative for weakness and numbness.  Psychiatric/Behavioral: Negative for agitation.     Physical Exam Updated Vital Signs BP 119/79 (BP Location: Right Arm)   Pulse 96   Temp 99.2 F (37.3 C)   Resp 14   LMP 06/24/2018  (Approximate)   SpO2 96%   Physical Exam  Constitutional: She appears well-developed and well-nourished. No distress.  Sitting at bedside in no apparent distress.  Nontoxic-appearing.  HENT:  Head: Normocephalic and atraumatic.  Mouth/Throat: Oropharynx is clear and moist. No oropharyngeal exudate.  Eyes: Pupils are equal, round, and reactive to light. Conjunctivae are normal. Right eye exhibits no discharge. Left eye exhibits no discharge.  Neck: Normal range of motion. Neck supple.  Cardiovascular: Normal rate, regular rhythm and intact distal pulses.  Pulmonary/Chest: Effort normal and breath sounds normal. No stridor. No respiratory distress. She has no wheezes. She has no rales.  Lungs clear to auscultation.  Dry cough at the end of expiration.  Abdominal: Soft. Bowel sounds are normal. There is no tenderness. There is no guarding.  Lymphadenopathy:    She has no cervical adenopathy.  Neurological: She is alert. Coordination normal.  Skin: Skin is warm and dry. She is not diaphoretic.  Psychiatric: She has a normal mood and affect. Her behavior is normal.  Nursing note and vitals reviewed.    ED Treatments / Results  Labs (all labs ordered are listed, but only abnormal results are displayed) Labs Reviewed  CBC  BASIC METABOLIC PANEL  I-STAT BETA HCG BLOOD, ED ([facility], [facility], [facility] ONLY)    EKG None  Radiology Dg Chest 2 View  Result Date: 07/08/2018 CLINICAL DATA:  Dry cough EXAM: CHEST - 2 VIEW COMPARISON:  None. FINDINGS: No pleural effusion. Normal heart size. Multiple bilateral nodular opacities. Possible 2.1 cm right upper lobe nodule. No pneumothorax. IMPRESSION: Multiple bilateral nodular airspace disease, could be secondary to diffuse atypical infection or possible pulmonary nodules. CT chest recommended to further evaluate. Electronically Signed   By: Jasmine PangKim  Fujinaga M.D.   On: 07/08/2018 00:31    Procedures Procedures (including critical care time)  Medications Ordered  in ED Medications  ondansetron (ZOFRAN-ODT) disintegrating tablet 4 mg (4 mg Oral Given 07/08/18 0030)     Initial Impression / Assessment and Plan / ED Course  I have reviewed the triage vital signs and the nursing notes.  Pertinent labs & imaging results that were available during my care of the patient were reviewed by me and considered in my medical decision making (see chart for details).     Patient presents with vague symptoms of nausea, dry cough, fatigue, tactile fevers and headaches over the past 4 days.  She has a low-grade temperature of 99.2 F in the ED.  She is nontoxic-appearing.  Lungs clear to auscultation.  Chest x-ray reveals diffuse bilateral nodules with concern for atypical infectious process, radiology recommends CT for further evaluation.  Otherwise CBC, BMP and i-STAT beta hCG unremarkable.  Plan to get CT chest for further evaluation.  Signout at shift change given to PA Sharilyn SitesLisa Sanders for disposition once CT scan returns.  Final Clinical Impressions(s) / ED Diagnoses   Final diagnoses:  None    ED Discharge Orders    None       Kellie ShropshireShrosbree, Emily J, PA-C  07/08/18 0119    Little, Ambrose Finland, MD 07/08/18 [phone]

## 2018-07-08 NOTE — ED Notes (Signed)
Family at bedside. 

## 2018-07-08 NOTE — H&P (Signed)
History and Physical    Sandra Shepard ZOX:[id number] DOB: 1991-05-25 DOA: 07/07/2018  PCP: Patient, No Pcp Per - [facility] Consultants:  None Patient coming from:  Home - lives with husband, parents, children (10, 8, 2); NOK: Husband, [phone]  Chief Complaint:  Cough  HPI: Sandra Shepard is a 27 y.o. female with no significant past medical history presenting with cough. She has been feeling nauseated, headache, mild cough.  Fever, subjective.  Symptoms started Friday afternoon.  Yesterday, she had chest pain that was severe and she had a cough.  Decreased PO, worsening nausea after eating.  Prior to Friday, she was perfectly ok.  No weight loss.  She runs 3 times a week and this has not changed.  +night sweats since Friday.  She reports having never left the [place] (she thought [place] and [place] might be international destinations but denied having ever been out of the country despite having been born in [place]).  [place] but she was already feeling sick.  No fresh water swimming (other than pools) or drinking.    ED Course:  Carryover - CT with diffuse atypical disease of unknown etiology, questionably fungal. Patient without recent travel outside the country, no hx of IVDU or cancer. No sick contacts. Denies hx of pulmonary illnesses in the past. No hemoptysis, night time sweats, or weight loss. Discussed with pulmonology, Dr. Belia Heman-- recommendation for admission, r/o TB and evaluation for atypical infection.  Have ordered sputum cultures, quantiferron, HIV, blood cultures. Will place on droplet precautions. Ordered dose of IV levaquin given likely atypical nature. Discussed with Dr. Julian Reil-- morning team will admit for ongoing care. Further abx based on their assessment/additional lab results.   Review of Systems: As per HPI; otherwise review of systems reviewed and negative.   Ambulatory Status:  Ambulates without  assistance  History reviewed. No pertinent past medical history.  Past Surgical History:  Procedure Laterality Date  . CHOLECYSTECTOMY N/A 06/26/2016   Procedure: LAPAROSCOPIC CHOLECYSTECTOMY WITH INTRAOPERATIVE CHOLANGIOGRAM;  Surgeon: Gaynelle Adu, MD;  Location: [facility] OR;  Service: General;  Laterality: N/A;    Social History   Socioeconomic History  . Marital status: Married    Spouse name: Not on file  . Number of children: Not on file  . Years of education: Not on file  . Highest education level: Not on file  Occupational History  . 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 Financial resource strain: Not on file  . Food insecurity:    Worry: Not on file    Inability: Not on file  . Transportation needs:    Medical: Not on file    Non-medical: Not on file  Tobacco Use  . Smoking status: Never Smoker  . Smokeless tobacco: Never Used  Substance and Sexual Activity  . Alcohol use: No  . Drug use: No  . Sexual activity: Not on file  Lifestyle  . Physical activity:    Days per week: Not on file    Minutes per session: Not on file  . Stress: Not on file  Relationships  . Social connections:    Talks on phone: Not on file    Gets together: Not on file    Attends religious service: Not on file    Active member of club or organization: Not on file    Attends meetings of clubs or organizations: Not on file    Relationship status: Not on file  . Intimate partner  violence:    Fear of current or ex partner: Not on file    Emotionally abused: Not on file    Physically abused: Not on file    Forced sexual activity: Not on file  Other Topics Concern  . Not on file  Social History Narrative  . Not on file    No Known Allergies  Family History  Problem Relation Age of Onset  . Diabetes Mother   . Cancer Neg Hx     Prior to Admission medications   Medication Sig Start Date End Date Taking? Authorizing Provider  ibuprofen (ADVIL,MOTRIN) 200 MG tablet Take 200 mg by mouth  every 6 (six) hours as needed for moderate pain.   Yes [provider]  HYDROcodone-acetaminophen (NORCO/VICODIN) 5-325 MG tablet Take 1-2 tablets by mouth every 6 (six) hours as needed. Patient not taking: Reported on 07/08/2018 06/27/16   Ovidio KinNewman, David, MD  omeprazole (PRILOSEC) 20 MG capsule Take 1 capsule (20 mg total) by mouth 2 (two) times daily. Patient not taking: Reported on 07/08/2018 05/20/16   Rolland PorterJames, Mark, MD  ondansetron (ZOFRAN ODT) 4 MG disintegrating tablet Take 1 tablet (4 mg total) by mouth every 8 (eight) hours as needed for nausea. Patient not taking: Reported on 07/08/2018 05/20/16   Rolland PorterJames, Mark, MD  ondansetron (ZOFRAN-ODT) 4 MG disintegrating tablet Take 1 tablet (4 mg total) by mouth every 6 (six) hours as needed for nausea. Patient not taking: Reported on 07/08/2018 06/27/16   Adam PhenixSimaan, Elizabeth S, PA-C  oxyCODONE (OXY IR/ROXICODONE) 5 MG immediate release tablet Take 1-2 tablets (5-10 mg total) by mouth every 6 (six) hours as needed for moderate pain. Patient not taking: Reported on 07/08/2018 06/27/16   Adam PhenixSimaan, Elizabeth S, PA-C  sucralfate (CARAFATE) 1 g tablet Take 1 tablet (1 g total) by mouth 4 (four) times daily. Patient not taking: Reported on 07/08/2018 05/20/16   Rolland PorterJames, Mark, MD    Physical Exam: Vitals:   07/08/18 0900 07/08/18 0915 07/08/18 0930 07/08/18 1045  BP: 113/71 (!) 137/122 107/64 105/73  Pulse: 80 84 76 83  Resp: (!) 21 (!) 23 19 18   Temp:      TempSrc:      SpO2: 96% 98% 95% 97%     General:  Appears calm and comfortable and is NAD Eyes:  PERRL, EOMI, normal lids, iris ENT:  grossly normal hearing, lips & tongue, mmm; appropriate dentition Neck:  no LAD, masses or thyromegaly; no carotid bruits Cardiovascular:  RRR, no m/r/g. No LE edema.  Respiratory:   CTA bilaterally with no wheezes/rales/rhonchi.  Normal respiratory effort. Abdomen:  soft, NT, ND, NABS Back:   normal alignment, no CVAT Skin:  no rash or induration seen on limited  exam Musculoskeletal:  grossly normal tone BUE/BLE, good ROM, no bony abnormality Lower extremity:  No LE edema.  Limited foot exam with no ulcerations.  2+ distal pulses. Psychiatric:  grossly normal mood and affect, speech fluent and appropriate, AOx3 Neurologic:  CN 2-12 grossly intact, moves all extremities in coordinated fashion, sensation intact    Radiological Exams on Admission: Dg Chest 2 View  Result Date: 07/08/2018 CLINICAL DATA:  Dry cough EXAM: CHEST - 2 VIEW COMPARISON:  None. FINDINGS: No pleural effusion. Normal heart size. Multiple bilateral nodular opacities. Possible 2.1 cm right upper lobe nodule. No pneumothorax. IMPRESSION: Multiple bilateral nodular airspace disease, could be secondary to diffuse atypical infection or possible pulmonary nodules. CT chest recommended to further evaluate. Electronically Signed   By: Selena BattenKim  Jake SamplesFujinaga M.D.   On: 07/08/2018 00:31   Ct Chest W Contrast  Result Date: 07/08/2018 CLINICAL DATA:  Cough. Central chest pain. Diffuse pulmonary nodules on radiograph. EXAM: CT CHEST WITH CONTRAST TECHNIQUE: Multidetector CT imaging of the chest was performed during intravenous contrast administration. CONTRAST:  75mL OMNIPAQUE IOHEXOL 300 MG/ML  SOLN COMPARISON:  Chest radiograph earlier this day. FINDINGS: Cardiovascular: Normal heart size. No pericardial effusion. Normal thoracic aorta. Conventional branching pattern from the aortic arch. Mediastinum/Nodes: 10 mm AP window node. No other mediastinal or hilar adenopathy. Soft tissue density in the anterior mediastinum may represent residual or recurrent thymus. The esophagus is decompressed. No thyroid nodule. Lungs/Pleura: Innumerable noncalcified nodules throughout all lobes of both lungs, majority are subcentimeter in size. There is a dominant low-density nodule in the right upper lobe measuring 2.1 x 2.3 cm with peripheral air bronchograms. There are also prominent nodules in the left lung, 1.4 x 1.3 cm in  the left lower lobe, as well as 1.3 x 1.3 cm lingular nodule. No pleural effusion or pulmonary edema. Trachea mainstem bronchi are patent. Upper Abdomen: No acute findings.  Prior cholecystectomy. Musculoskeletal: There are no acute or suspicious osseous abnormalities. IMPRESSION: 1. Innumerable pulmonary nodules throughout both lungs, majority subcentimeter, however dominant nodule in the right upper lobe measures 2.3 x 2.2 cm. Etiology is indeterminate, favor atypical infection such as fungal, additional etiologies such as inflammatory or underlying neoplastic processes are also considered. Consider pulmonary consultation. Radiographic follow-up recommended. 2. Prominent AP window node is likely reactive. Electronically Signed   By: Rubye OaksMelanie  Ehinger M.D.   On: 07/08/2018 03:13    EKG: not done  Labs on Admission: I have personally reviewed the available labs and imaging studies at the time of the admission.  Pertinent labs:   Normal BMP Normal CBC HIV negative Upreg negative Blood cultures negative Lactate 1.21  Assessment/Plan Principal Problem:   Cough   -Patient without significant medical history presenting with vague symptoms including cough -Overnight CT with "innumerable pulmonary nodules" in both lungs of uncertain etiology -Pulmonology consult requested; she may require bronchoscopy -With acute onset of symptoms, infectious etiology seems more likely -However, this is such an unusual presentation that it raises the question of malignancy -She was given Levaquin x 1 in the ER; will hold for now given normal WBC count and negative lactate -Will check procalcitonin level -HIV negative Blood cultures are pending -Quantiferon gold is pending, will place in negative pressure room for now on airborne precautions -Consider ID consultation pending recommendations from pulmonology    DVT prophylaxis: Lovenox  Code Status:  Full - confirmed with patient/family Family  Communication: Husband present throughout the evaluation  Disposition Plan:  Home once clinically improved Consults called: Pulmonology  Admission status: Admit - It is my clinical opinion that admission to INPATIENT is reasonable and necessary because of the expectation that this patient will require hospital care that crosses at least 2 midnights to treat this condition based on the medical complexity of the problems presented.  Given the aforementioned information, the predictability of an adverse outcome is felt to be significant.    Jonah BlueJennifer Mathilde Mcwherter MD [facility]  If note is complete, please contact covering daytime or nighttime physician. [email or web address] Password [email or web address]  07/08/2018, 1:02 PM

## 2018-07-09 DIAGNOSIS — J181 Lobar pneumonia, unspecified organism: Secondary | ICD-10-CM

## 2018-07-09 DIAGNOSIS — E669 Obesity, unspecified: Secondary | ICD-10-CM | POA: Diagnosis present

## 2018-07-09 DIAGNOSIS — J189 Pneumonia, unspecified organism: Secondary | ICD-10-CM | POA: Diagnosis present

## 2018-07-09 DIAGNOSIS — Z9049 Acquired absence of other specified parts of digestive tract: Secondary | ICD-10-CM

## 2018-07-09 LAB — COMPREHENSIVE METABOLIC PANEL
ALK PHOS: 73 U/L (ref 38–126)
ALT: 14 U/L (ref 0–44)
AST: 19 U/L (ref 15–41)
Albumin: 3.2 g/dL — ABNORMAL LOW (ref 3.5–5.0)
Anion gap: 9 (ref 5–15)
BUN: 6 mg/dL (ref 6–20)
CALCIUM: 8.3 mg/dL — AB (ref 8.9–10.3)
CO2: 21 mmol/L — AB (ref 22–32)
CREATININE: 0.65 mg/dL (ref 0.44–1.00)
Chloride: 109 mmol/L (ref 98–111)
GFR calc Af Amer: >60 mL/min (ref >60)
GFR calc non Af Amer: >60 mL/min (ref >60)
GLUCOSE: 95 mg/dL (ref 70–99)
Potassium: 3.5 mmol/L (ref 3.5–5.1)
Sodium: 139 mmol/L (ref 135–145)
Total Bilirubin: 0.8 mg/dL (ref 0.3–1.2)
Total Protein: 6.8 g/dL (ref 6.5–8.1)

## 2018-07-09 LAB — CBC
HCT: 37.3 % (ref 36.0–46.0)
HEMOGLOBIN: 12.1 g/dL (ref 12.0–15.0)
MCH: 29.2 pg (ref 26.0–34.0)
MCHC: 32.4 g/dL (ref 30.0–36.0)
MCV: 89.9 fL (ref 78.0–100.0)
Platelets: 218 10*3/uL (ref 150–400)
RBC: 4.15 MIL/uL (ref 3.87–5.11)
RDW: 12.3 % (ref 11.5–15.5)
WBC: 8.9 10*3/uL (ref 4.0–10.5)

## 2018-07-09 LAB — DIFFERENTIAL
Abs Immature Granulocytes: 0 10*3/uL (ref 0.0–0.1)
Basophils Absolute: 0 10*3/uL (ref 0.0–0.1)
Basophils Relative: 0 %
Eosinophils Absolute: 0.2 10*3/uL (ref 0.0–0.7)
Eosinophils Relative: 2 %
Immature Granulocytes: 0 %
LYMPHS ABS: 1.3 10*3/uL (ref 0.7–4.0)
LYMPHS PCT: 15 %
MONOS PCT: 7 %
Monocytes Absolute: 0.6 10*3/uL (ref 0.1–1.0)
NEUTROS ABS: 6.9 10*3/uL (ref 1.7–7.7)
Neutrophils Relative %: 76 %

## 2018-07-09 LAB — ANTINUCLEAR ANTIBODIES, IFA: ANA Ab, IFA: NEGATIVE

## 2018-07-09 LAB — ANTI-SCLERODERMA ANTIBODY: Scleroderma (Scl-70) (ENA) Antibody, IgG: 0.2 AI (ref 0.0–0.9)

## 2018-07-09 LAB — PROCALCITONIN: Procalcitonin: <0.1 ng/mL

## 2018-07-09 LAB — C4 COMPLEMENT: COMPLEMENT C4, BODY FLUID: 27 mg/dL (ref 14–44)

## 2018-07-09 LAB — SJOGRENS SYNDROME-A EXTRACTABLE NUCLEAR ANTIBODY

## 2018-07-09 LAB — SJOGRENS SYNDROME-B EXTRACTABLE NUCLEAR ANTIBODY: SSB (La) (ENA) Antibody, IgG: <0.2 AI (ref 0.0–0.9)

## 2018-07-09 LAB — CYCLIC CITRUL PEPTIDE ANTIBODY, IGG/IGA: CCP Antibodies IgG/IgA: 5 units (ref 0–19)

## 2018-07-09 LAB — RHEUMATOID FACTOR: Rhuematoid fact SerPl-aCnc: <10 IU/mL (ref 0.0–13.9)

## 2018-07-09 LAB — C3 COMPLEMENT: C3 COMPLEMENT: 173 mg/dL — AB (ref 82–167)

## 2018-07-09 LAB — ANTI-DNA ANTIBODY, DOUBLE-STRANDED: ds DNA Ab: 2 IU/mL (ref 0–9)

## 2018-07-09 NOTE — Progress Notes (Addendum)
Patient ID: Sandra Shepard, female   DOB: 03/14/1991, 27 y.o.   MRN: [id number]         [facility]    Date of Admission:  07/07/2018           Day 2 ceftriaxone        Day 2 azithromycin       Reason for Consult: Pneumonitis    Referring Provider: Dr. Marcellus Scott  Assessment: She has diffuse, patchy nodular infiltrates on chest x-ray and CT scan.  Largest nodules in her right upper lobe.  The cause of her acute illness remains unclear.  I agree with evaluation for possible infection as well as other causes of hypersensitivity pneumonitis/vasculitis.  I think it is reasonable to continue her current antibiotics for now.  She will undergo diagnostic bronchoscopy tomorrow.  Plan: 1. Continue current antibiotics 2. Add differential to CBC 3. Discontinue droplet precautions 4. Agree with pending diagnostic tests and bronchoscopy scheduled for tomorrow  Principal Problem:   Pneumonitis Active Problems:   Cough   Obesity (BMI 30.0-34.9)   Status post laparoscopic cholecystectomy   Scheduled Meds: . enoxaparin (LOVENOX) injection  40 mg Subcutaneous Q24H   Continuous Infusions: . azithromycin 500 mg (07/09/18 0615)  . cefTRIAXone (ROCEPHIN)  IV Stopped (07/08/18 1805)  . lactated ringers Stopped (07/08/18 1642)   PRN Meds:.acetaminophen **OR** acetaminophen, ibuprofen, ondansetron **OR** ondansetron (ZOFRAN) IV  HPI: Sandra Shepard is a 27 y.o. female who has previously been in good health until she developed nausea and malaise 1 week ago.  She also noted some headache and subjective fevers.  She began to develop a dry cough with some central reticulocyte chest pain.  She did not note any shortness of breath.  She came to the hospital where she was noted to have diffuse bilateral infiltrates chest x-ray and CT scan leading to admission.  She was started on empiric therapy for possible community-acquired pneumonia.  She has had no documented fever  here.  Her only other previous hospitalizations were for laparoscopic cholecystectomy 2 years ago for childbirth.  She is not on any medications.  She has never used any tobacco products or any cigarettes.  She does not drink alcohol use illicit drug.  She is originally from [place] but has not been back there in over 20 years.  She has never traveled anywhere else outside of the [place].  She went to [facility] 6 days ago, but this was after she began feeling poorly.  She works in a [employer].  She says that the conditions they are very clean.  She has not been around anyone with a similar illness that she is aware of history of tuberculosis exposure to tuberculosis.  She has no pets and has no recent unusual animal exposures. She is not on any medications or supplements.  She is HIV negative.   Review of Systems: Review of Systems  Constitutional: Positive for fever and malaise/fatigue. Negative for chills, diaphoresis and weight loss.  HENT: Negative for congestion and sore throat.   Eyes: Negative for pain and redness.  Respiratory: Positive for cough. Negative for hemoptysis, sputum production, shortness of breath and wheezing.   Cardiovascular: Positive for chest pain.  Gastrointestinal: Positive for nausea. Negative for abdominal pain, diarrhea, heartburn and vomiting.  Genitourinary: Negative for dysuria.  Musculoskeletal: Negative for joint pain and myalgias.  Skin: Negative for rash.  Neurological: Positive for headaches. Negative for dizziness.  Psychiatric/Behavioral: Negative for substance  abuse.    History reviewed. No pertinent past medical history.  Social History   Tobacco Use  . Smoking status: Never Smoker  . Smokeless tobacco: Never Used  Substance Use Topics  . Alcohol use: No  . Drug use: No    Family History  Problem Relation Age of Onset  . Diabetes Mother   . Cancer Neg Hx    No Known Allergies  OBJECTIVE: Blood pressure 113/71,  pulse 82, temperature 98.4 F (36.9 C), temperature source Oral, resp. rate 16, height 5\' 1"  (1.549 m), weight 73.5 kg, last menstrual period 06/24/2018, SpO2 95 %.  Physical Exam  Constitutional: She is oriented to person, place, and time.  She is alert and in no distress.  She is sitting up in bed watching television and visiting with her family.  HENT:  Mouth/Throat: No oropharyngeal exudate.  Eyes: Conjunctivae are normal.  Neck: Neck supple.  Cardiovascular: Normal rate, regular rhythm and normal heart sounds.  No murmur heard. Pulmonary/Chest: Effort normal. She has no wheezes. She has rales.  She has bibasilar crackles posteriorly.  Abdominal: Soft. Bowel sounds are normal. She exhibits no distension. There is no tenderness.  Musculoskeletal: Normal range of motion. She exhibits no edema or tenderness.  Lymphadenopathy:       Head (right side): No submandibular adenopathy present.       Head (left side): No submandibular adenopathy present.    She has no cervical adenopathy.    She has no axillary adenopathy.  Neurological: She is alert and oriented to person, place, and time.  Skin: No rash noted.  Psychiatric: She has a normal mood and affect.    Lab Results Lab Results  Component Value Date   WBC 8.9 07/09/2018   HGB 12.1 07/09/2018   HCT 37.3 07/09/2018   MCV 89.9 07/09/2018   PLT 218 07/09/2018    Lab Results  Component Value Date   CREATININE 0.65 07/09/2018   BUN 6 07/09/2018   NA 139 07/09/2018   K 3.5 07/09/2018   CL 109 07/09/2018   CO2 21 (L) 07/09/2018    Lab Results  Component Value Date   ALT 14 07/09/2018   AST 19 07/09/2018   ALKPHOS 73 07/09/2018   BILITOT 0.8 07/09/2018    Sed Rate (mm/hr)  Date Value  07/08/2018 58 (H)   CRP (mg/dL)  Date Value  16/10/960408/21/2019 9.8 (H)   Microbiology: Recent Results (from the past 240 hour(s))  Blood culture (routine x 2)     Status: None (Preliminary result)   Collection Time: 07/08/18  4:47 AM   Result Value Ref Range Status   Specimen Description BLOOD RIGHT ANTECUBITAL  Final   Special Requests   Final    BOTTLES DRAWN AEROBIC AND ANAEROBIC Blood Culture adequate volume   Culture   Final    NO GROWTH 1 DAY Performed at [facility], [address] [place], [place] [address]    Report Status PENDING  Incomplete  Blood culture (routine x 2)     Status: None (Preliminary result)   Collection Time: 07/08/18  4:53 AM  Result Value Ref Range Status   Specimen Description BLOOD LEFT ANTECUBITAL  Final   Special Requests   Final    BOTTLES DRAWN AEROBIC AND ANAEROBIC Blood Culture adequate volume   Culture   Final    NO GROWTH 1 DAY Performed at [facility], [address] [place], [place] [address]    Report Status  PENDING  Incomplete  Respiratory Panel by PCR     Status: None   Collection Time: 07/08/18  2:35 PM  Result Value Ref Range Status   Adenovirus NOT DETECTED NOT DETECTED Final   Coronavirus 229E NOT DETECTED NOT DETECTED Final   Coronavirus HKU1 NOT DETECTED NOT DETECTED Final   Coronavirus NL63 NOT DETECTED NOT DETECTED Final   Coronavirus OC43 NOT DETECTED NOT DETECTED Final   Metapneumovirus NOT DETECTED NOT DETECTED Final   Rhinovirus / Enterovirus NOT DETECTED NOT DETECTED Final   Influenza A NOT DETECTED NOT DETECTED Final   Influenza B NOT DETECTED NOT DETECTED Final   Parainfluenza Virus 1 NOT DETECTED NOT DETECTED Final   Parainfluenza Virus 2 NOT DETECTED NOT DETECTED Final   Parainfluenza Virus 3 NOT DETECTED NOT DETECTED Final   Parainfluenza Virus 4 NOT DETECTED NOT DETECTED Final   Respiratory Syncytial Virus NOT DETECTED NOT DETECTED Final   Bordetella pertussis NOT DETECTED NOT DETECTED Final   Chlamydophila pneumoniae NOT DETECTED NOT DETECTED Final   Mycoplasma pneumoniae NOT DETECTED NOT DETECTED Final    Comment: Performed at [facility], [address], [place], [place] [address]    Cliffton Asters, MD [facility] [phone] pager   [phone] cell 07/09/2018, 12:06 PM

## 2018-07-09 NOTE — Progress Notes (Signed)
PROGRESS NOTE   Sandra Shepard  OJJ:[id number]    DOB: [birth year]    DOA: 07/07/2018  PCP: Patient, No Pcp Per   I have briefly reviewed patients previous medical records in [facility].  Brief Narrative:  27 year old female with no significant past medical history, presented with approximately 1 week history of nausea, malaise, headache, subjective fevers, dry cough, nonspecific chest pain with coughing, no dyspnea.  Evaluation in ED including chest x-ray and CT chest showed innumerable pulmonary nodules concerning for atypical infection.  Empirically started on IV ceftriaxone and azithromycin.  Placed on droplet isolation.  Pulmonology consulted and plan bronchoscopy and BAL 8/23.  ID consulted 8/22.   Assessment & Plan:   Principal Problem:   Pneumonitis Active Problems:   Cough   Obesity (BMI 30.0-34.9)   Status post laparoscopic cholecystectomy   Cough with CT chest showing multiple bilateral pulmonary nodules: Unclear etiology.  No history of tobacco, sick contacts, pet exposures, recent travel out of country/TB exposure, vaping.  Empirically started on IV ceftriaxone and azithromycin.  Concern for atypical infection versus hypersensitivity reaction.  Low index of suspicion for TB.  Pulmonary consulted and plan bronchoscopy and BAL 8/23 (nonproductive cough).  ID consulted, discontinue droplet isolation and await bronchoscopy results.  Blood cultures x2: Negative to date.  RSV panel negative.  CRP 9.8.  ESR 58.  Procalcitonin <0.1.  ANA, RA, dsDNA, SSA, SSB, SCL 70, HIV testing: Negative.  C3 complement elevated at 173.  Pregnancy test negative.    DVT prophylaxis: Lovenox Code Status: Full Family Communication: Discussed with patient's husband at bedside. Disposition: DC home pending clinical improvement and further evaluation.   Consultants:  Pulmonology Infectious disease  Procedures:  None  Antimicrobials:  IV ceftriaxone and azithromycin   Subjective: Feels  better than on admission.  No nausea, headache or chest pain.  Denies dyspnea.  Cough improved but mostly nonproductive and has.'s of hacking cough.  ROS: As above otherwise negative.  Objective:  Vitals:   07/08/18 1620 07/08/18 1625 07/08/18 2149 07/09/18 0337  BP:  104/68 108/71 113/71  Pulse:  79 79 82  Resp:  _0 Temp:  98.5 F (36.9 C) 99 F (37.2 C) 98.4 F (36.9 C)  TempSrc:  Oral Oral Oral  SpO2:  96% 96% 95%  Weight: 73.5 kg     Height: _1  (1.549 m)       Examination:  General exam: Pleasant young female, well-built and nourished, lying comfortably propped up in bed.  Does not look septic or toxic. Respiratory system: Slightly harsh breath sounds bilaterally but no wheezing, rhonchi or crackles. Respiratory effort normal. Cardiovascular system: S1 & S2 heard, RRR. No JVD, murmurs, rubs, gallops or clicks. No pedal edema. Gastrointestinal system: Abdomen is nondistended, soft and nontender. No organomegaly or masses felt. Normal bowel sounds heard. Central nervous system: Alert and oriented. No focal neurological deficits. Extremities: Symmetric 5 x 5 power. Skin: No rashes, lesions or ulcers Psychiatry: Judgement and insight appear normal. Mood & affect appropriate.     Data Reviewed: I have personally reviewed following labs and imaging studies  CBC: Recent Labs  Lab 07/08/18 0011 07/09/18 0411  WBC 8.8 8.9  NEUTROABS  --  6.9  HGB 13.3 12.1  HCT 41.6 37.3  MCV 91.4 89.9  PLT 235 937   Basic Metabolic Panel: Recent Labs  Lab 07/08/18 0011 07/09/18 0411  NA 141 139  K 4.1 3.5  CL 107 109  CO2 25 21*  GLUCOSE 99 95  BUN 9 6  CREATININE 0.65 0.65  CALCIUM 8.9 8.3*   Liver Function Tests: Recent Labs  Lab 07/09/18 0411  AST 19  ALT 14  ALKPHOS 73  BILITOT 0.8  PROT 6.8  ALBUMIN 3.2*     Recent Results (from the past 240 hour(s))  Blood culture (routine x 2)     Status: None (Preliminary result)   Collection Time: 07/08/18   4:47 AM  Result Value Ref Range Status   Specimen Description BLOOD RIGHT ANTECUBITAL  Final   Special Requests   Final    BOTTLES DRAWN AEROBIC AND ANAEROBIC Blood Culture adequate volume   Culture   Final    NO GROWTH 1 DAY Performed at [facility], [place] [address] [place], [place] [address]    Report Status PENDING  Incomplete  Blood culture (routine x 2)     Status: None (Preliminary result)   Collection Time: 07/08/18  4:53 AM  Result Value Ref Range Status   Specimen Description BLOOD LEFT ANTECUBITAL  Final   Special Requests   Final    BOTTLES DRAWN AEROBIC AND ANAEROBIC Blood Culture adequate volume   Culture   Final    NO GROWTH 1 DAY Performed at [facility], [place] [address], [place], [place] [address]    Report Status PENDING  Incomplete  Respiratory Panel by PCR     Status: None   Collection Time: 07/08/18  2:35 PM  Result Value Ref Range Status   Adenovirus NOT DETECTED NOT DETECTED Final   Coronavirus 229E NOT DETECTED NOT DETECTED Final   Coronavirus HKU1 NOT DETECTED NOT DETECTED Final   Coronavirus NL63 NOT DETECTED NOT DETECTED Final   Coronavirus OC43 NOT DETECTED NOT DETECTED Final   Metapneumovirus NOT DETECTED NOT DETECTED Final   Rhinovirus / Enterovirus NOT DETECTED NOT DETECTED Final   Influenza A NOT DETECTED NOT DETECTED Final   Influenza B NOT DETECTED NOT DETECTED Final   Parainfluenza Virus 1 NOT DETECTED NOT DETECTED Final   Parainfluenza Virus 2 NOT DETECTED NOT DETECTED Final   Parainfluenza Virus 3 NOT DETECTED NOT DETECTED Final   Parainfluenza Virus 4 NOT DETECTED NOT DETECTED Final   Respiratory Syncytial Virus NOT DETECTED NOT DETECTED Final   Bordetella pertussis NOT DETECTED NOT DETECTED Final   Chlamydophila pneumoniae NOT DETECTED NOT DETECTED Final   Mycoplasma pneumoniae NOT DETECTED NOT DETECTED Final    Comment: Performed at [facility], [place] [address], [place], [place] [address]         Radiology  Studies: Dg Chest 2 View  Result Date: 07/08/2018 CLINICAL DATA:  Dry cough EXAM: CHEST - 2 VIEW COMPARISON:  None. FINDINGS: No pleural effusion. Normal heart size. Multiple bilateral nodular opacities. Possible 2.1 cm right upper lobe nodule. No pneumothorax. IMPRESSION: Multiple bilateral nodular airspace disease, could be secondary to diffuse atypical infection or possible pulmonary nodules. CT chest recommended to further evaluate. Electronically Signed   By: Donavan Foil M.D.   On: 07/08/2018 00:31   Ct Chest W Contrast  Result Date: 07/08/2018 CLINICAL DATA:  Cough. Central chest pain. Diffuse pulmonary nodules on radiograph. EXAM: CT CHEST WITH CONTRAST TECHNIQUE: Multidetector CT imaging of the chest was performed during intravenous contrast administration. CONTRAST:  48m OMNIPAQUE IOHEXOL 300 MG/ML  SOLN COMPARISON:  Chest radiograph earlier this day. FINDINGS: Cardiovascular: Normal heart size. No pericardial effusion. Normal thoracic aorta. Conventional branching pattern from the aortic arch. Mediastinum/Nodes: 10  mm AP window node. No other mediastinal or hilar adenopathy. Soft tissue density in the anterior mediastinum may represent residual or recurrent thymus. The esophagus is decompressed. No thyroid nodule. Lungs/Pleura: Innumerable noncalcified nodules throughout all lobes of both lungs, majority are subcentimeter in size. There is a dominant low-density nodule in the right upper lobe measuring 2.1 x 2.3 cm with peripheral air bronchograms. There are also prominent nodules in the left lung, 1.4 x 1.3 cm in the left lower lobe, as well as 1.3 x 1.3 cm lingular nodule. No pleural effusion or pulmonary edema. Trachea mainstem bronchi are patent. Upper Abdomen: No acute findings.  Prior cholecystectomy. Musculoskeletal: There are no acute or suspicious osseous abnormalities. IMPRESSION: 1. Innumerable pulmonary nodules throughout both lungs, majority subcentimeter, however dominant nodule in  the right upper lobe measures 2.3 x 2.2 cm. Etiology is indeterminate, favor atypical infection such as fungal, additional etiologies such as inflammatory or underlying neoplastic processes are also considered. Consider pulmonary consultation. Radiographic follow-up recommended. 2. Prominent AP window node is likely reactive. Electronically Signed   By: Jeb Levering M.D.   On: 07/08/2018 03:13        Scheduled Meds: . enoxaparin (LOVENOX) injection  40 mg Subcutaneous Q24H   Continuous Infusions: . azithromycin 500 mg (07/09/18 0615)  . cefTRIAXone (ROCEPHIN)  IV 1 g (07/09/18 1439)  . lactated ringers Stopped (07/08/18 1642)     LOS: 1 day     Vernell Leep, MD, FACP, [facility]. [facility] Pager [phone]  If 7PM-7AM, please contact night-coverage [email or web address] Password [facility] 07/09/2018, 5:17 PM

## 2018-07-09 NOTE — Plan of Care (Signed)

## 2018-07-10 ENCOUNTER — Inpatient Hospital Stay (HOSPITAL_COMMUNITY): Payer: BLUE CROSS/BLUE SHIELD

## 2018-07-10 ENCOUNTER — Encounter (HOSPITAL_COMMUNITY)
Admission: EM | Disposition: A | Discharge status: Home / Self Care | Payer: Self-pay | Source: Home / Self Care | Attending: Internal Medicine

## 2018-07-10 DIAGNOSIS — J189 Pneumonia, unspecified organism: Secondary | ICD-10-CM

## 2018-07-10 DIAGNOSIS — R0989 Other specified symptoms and signs involving the circulatory and respiratory systems: Secondary | ICD-10-CM

## 2018-07-10 DIAGNOSIS — R918 Other nonspecific abnormal finding of lung field: Secondary | ICD-10-CM

## 2018-07-10 HISTORY — PX: VIDEO BRONCHOSCOPY: SHX5072

## 2018-07-10 LAB — BODY FLUID CELL COUNT WITH DIFFERENTIAL
Eos, Fluid: 4 %
Lymphs, Fluid: 39 %
Monocyte-Macrophage-Serous Fluid: 19 % — ABNORMAL LOW (ref 50–90)
Neutrophil Count, Fluid: 38 % — ABNORMAL HIGH (ref 0–25)
Other Cells, Fluid: 0 %
Total Nucleated Cell Count, Fluid: 680 cu mm (ref 0–1000)

## 2018-07-10 LAB — PROCALCITONIN

## 2018-07-10 LAB — STREP PNEUMONIAE URINARY ANTIGEN: Strep Pneumo Urinary Antigen: NEGATIVE

## 2018-07-10 LAB — ANCA TITERS
Atypical P-ANCA titer: <1:20 {titer}
C-ANCA: <1:20 {titer}
P-ANCA: <1:20 {titer}

## 2018-07-10 LAB — ANGIOTENSIN CONVERTING ENZYME: ANGIOTENSIN-CONVERTING ENZYME: 41 U/L (ref 14–82)

## 2018-07-10 SURGERY — BRONCHOSCOPY, WITH FLUOROSCOPY
Anesthesia: Moderate Sedation | Laterality: Bilateral

## 2018-07-10 MED ORDER — LIDOCAINE HCL (PF) 1 % IJ SOLN
INTRAMUSCULAR | Status: DC | PRN
  Administered 2018-07-10: 6 mL

## 2018-07-10 MED ORDER — SODIUM CHLORIDE 0.9 % IV SOLN
INTRAVENOUS | Status: DC
  Administered 2018-07-10 – 2018-07-11 (×2): via INTRAVENOUS

## 2018-07-10 MED ORDER — FENTANYL CITRATE (PF) 100 MCG/2ML IJ SOLN
INTRAMUSCULAR | Status: DC | PRN
  Administered 2018-07-10 (×5): 25 ug via INTRAVENOUS

## 2018-07-10 MED ORDER — PHENYLEPHRINE HCL 0.25 % NA SOLN
NASAL | Status: DC | PRN
  Administered 2018-07-10: 2 via NASAL

## 2018-07-10 MED ORDER — MIDAZOLAM HCL 10 MG/2ML IJ SOLN
INTRAMUSCULAR | Status: DC | PRN
  Administered 2018-07-10 (×6): 1 mg via INTRAVENOUS

## 2018-07-10 MED ORDER — LIDOCAINE HCL 2 % EX GEL: 1.0000 "application " | Freq: Once | CUTANEOUS | Status: DC

## 2018-07-10 MED ORDER — MIDAZOLAM HCL 5 MG/ML IJ SOLN
INTRAMUSCULAR | Status: AC
  Filled 2018-07-10: qty 2

## 2018-07-10 MED ORDER — LIDOCAINE HCL URETHRAL/MUCOSAL 2 % EX GEL
CUTANEOUS | Status: DC | PRN
  Administered 2018-07-10: 1

## 2018-07-10 MED ORDER — FENTANYL CITRATE (PF) 100 MCG/2ML IJ SOLN
INTRAMUSCULAR | Status: AC
  Filled 2018-07-10: qty 4

## 2018-07-10 MED ORDER — PHENYLEPHRINE HCL 0.25 % NA SOLN: 1.0000 | Freq: Four times a day (QID) | NASAL | Status: DC | PRN

## 2018-07-10 MED ORDER — BUTAMBEN-TETRACAINE-BENZOCAINE 2-2-14 % EX AERO: 1.0000 | INHALATION_SPRAY | Freq: Once | CUTANEOUS | Status: DC

## 2018-07-10 NOTE — Progress Notes (Signed)
Patient ID: Sandra Shepard, female   DOB: [birth year], 27 y.o.   MRN: [id number]         [facility] Infectious Disease  Date of Admission:  07/07/2018           Day 3 ceftriaxone        Day 3 azithromycin ASSESSMENT: So far there is no explanation for her cough and diffuse, bilateral nodular infiltrates.  She remains afebrile.  Admission blood cultures and respiratory virus panel are negative.  She is HIV negative.  She does not have any peripheral eosinophilia and initial autoimmune work-up is negative.  PLAN: 1. Continue empiric antibiotic therapy for now 2. Diagnostic bronchoscopy later today  Principal Problem:   Pneumonitis Active Problems:   Cough   Obesity (BMI 30.0-34.9)   Status post laparoscopic cholecystectomy   Scheduled Meds: . enoxaparin (LOVENOX) injection  40 mg Subcutaneous Q24H   Continuous Infusions: . azithromycin 500 mg (07/10/18 0604)  . cefTRIAXone (ROCEPHIN)  IV 1 g (07/09/18 1439)   PRN Meds:.acetaminophen **OR** acetaminophen, ibuprofen, ondansetron **OR** ondansetron (ZOFRAN) IV   SUBJECTIVE: She has not noted any improvement in her dry cough.  She is not having any more nausea.  Review of Systems: Review of Systems  Constitutional: Negative for chills, diaphoresis and fever.  Respiratory: Positive for cough. Negative for hemoptysis, sputum production, shortness of breath and wheezing.   Cardiovascular: Negative for chest pain.  Gastrointestinal: Negative for abdominal pain, diarrhea, nausea and vomiting.    No Known Allergies  OBJECTIVE: Vitals:   07/08/18 2149 07/09/18 0337 07/09/18 1921 07/10/18 0500  BP: 108/71 113/71 102/67 110/66  Pulse: 79 82 69 70  Resp: 18 16 20 20   Temp: 99 F (37.2 C) 98.4 F (36.9 C) 98.7 F (37.1 C) 98.6 F (37 C)  TempSrc: Oral Oral Oral Oral  SpO2: 96% 95% 98% 100%  Weight:      Height:       Body mass index is 30.62 kg/m.  Physical Exam  Constitutional: She is oriented to person,  place, and time.  She is resting quietly in bed.  Cardiovascular: Normal rate, regular rhythm and normal heart sounds.  No murmur heard. Pulmonary/Chest: Effort normal. She has no wheezes. She has rales.  Neurological: She is alert and oriented to person, place, and time.  Skin: No rash noted.  Psychiatric: She has a normal mood and affect.    Lab Results Lab Results  Component Value Date   WBC 8.9 07/09/2018   HGB 12.1 07/09/2018   HCT 37.3 07/09/2018   MCV 89.9 07/09/2018   PLT 218 07/09/2018    Lab Results  Component Value Date   CREATININE 0.65 07/09/2018   BUN 6 07/09/2018   NA 139 07/09/2018   K 3.5 07/09/2018   CL 109 07/09/2018   CO2 21 (L) 07/09/2018    Lab Results  Component Value Date   ALT 14 07/09/2018   AST 19 07/09/2018   ALKPHOS 73 07/09/2018   BILITOT 0.8 07/09/2018     Microbiology: Recent Results (from the past 240 hour(s))  Blood culture (routine x 2)     Status: None (Preliminary result)   Collection Time: 07/08/18  4:47 AM  Result Value Ref Range Status   Specimen Description BLOOD RIGHT ANTECUBITAL  Final   Special Requests   Final    BOTTLES DRAWN AEROBIC AND ANAEROBIC Blood Culture adequate volume   Culture   Final    NO GROWTH 2 DAYS Performed at  [facility], [address] [place], [place] [address]    Report Status PENDING  Incomplete  Blood culture (routine x 2)     Status: None (Preliminary result)   Collection Time: 07/08/18  4:53 AM  Result Value Ref Range Status   Specimen Description BLOOD LEFT ANTECUBITAL  Final   Special Requests   Final    BOTTLES DRAWN AEROBIC AND ANAEROBIC Blood Culture adequate volume   Culture   Final    NO GROWTH 2 DAYS Performed at [facility], [address] [place], [place] [address]    Report Status PENDING  Incomplete  Respiratory Panel by PCR     Status: None   Collection Time: 07/08/18  2:35 PM  Result Value Ref Range Status   Adenovirus NOT DETECTED NOT DETECTED Final    Coronavirus 229E NOT DETECTED NOT DETECTED Final   Coronavirus HKU1 NOT DETECTED NOT DETECTED Final   Coronavirus NL63 NOT DETECTED NOT DETECTED Final   Coronavirus OC43 NOT DETECTED NOT DETECTED Final   Metapneumovirus NOT DETECTED NOT DETECTED Final   Rhinovirus / Enterovirus NOT DETECTED NOT DETECTED Final   Influenza A NOT DETECTED NOT DETECTED Final   Influenza B NOT DETECTED NOT DETECTED Final   Parainfluenza Virus 1 NOT DETECTED NOT DETECTED Final   Parainfluenza Virus 2 NOT DETECTED NOT DETECTED Final   Parainfluenza Virus 3 NOT DETECTED NOT DETECTED Final   Parainfluenza Virus 4 NOT DETECTED NOT DETECTED Final   Respiratory Syncytial Virus NOT DETECTED NOT DETECTED Final   Bordetella pertussis NOT DETECTED NOT DETECTED Final   Chlamydophila pneumoniae NOT DETECTED NOT DETECTED Final   Mycoplasma pneumoniae NOT DETECTED NOT DETECTED Final    Comment: Performed at [facility], [address] [place], [place] [address]    Cliffton AstersJohn Lorane Cousar, MD [facility] [phone] pager   [phone] cell 07/10/2018, 10:56 AM

## 2018-07-10 NOTE — Progress Notes (Addendum)
Name: Sandra Shepard MRN: [id number] DOB: 04/23/1991    ADMISSION DATE:  07/07/2018 CONSULTATION DATE:  07/08/2018  REFERRING MD :  Dr. Lorin Mercy  CHIEF COMPLAINT:  Lung nodules  HISTORY OF PRESENT ILLNESS:   27 year old Hispanic, Sandra Shepard speaking female with no significant PMH presenting for nausea and non productive cough, and intermittent headaches since Friday.  Reports subjective fevers, last one on Monday.   Patient is originally from [place] and has been in [place] for over 20 years and has not visited since.  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  She lives at home with her husband and three children.   Denies pets, bird exposures, recent travel, other sick contacts in the home, chills, night sweats, joint pains, rash, or dysphagia.  Denies known mold exposures, jail time or TB exposure.  Had childhood vaccinations that she knows of.  No known significant family history.  Never smoker or second-hand exposure, no ETOH use, or drug use, specifically IVDA.   In the ED, patient had unremarkable labs, hemodynamically stable and saturations > 94% on room air.  CXR was abnormal showing multiple bilateral nodular opacities therefore a CT chest was performed which showed numerable, mainly subcentimeter bilaterally, with a dominant nodule in the RUL.  She was placed on airborne precautions and admitted by [facility].  PCCM consulted for further recommendations and testing.   PAST MEDICAL HISTORY :   has no past medical history on file.  has a past surgical history that includes Cholecystectomy (N/A, 06/26/2016).   Prior to Admission medications   Medication Sig Start Date End Date Taking? Authorizing Provider  ibuprofen (ADVIL,MOTRIN) 200 MG tablet Take 200 mg by mouth every 6 (six) hours as needed for moderate pain.   Yes [provider]   No Known  Allergies  FAMILY HISTORY:  family history includes Diabetes in her mother. SOCIAL HISTORY:  reports that she has never smoked. She has never used smokeless tobacco. She reports that she does not drink alcohol or use drugs.  REVIEW OF SYSTEMS:  All negative; except for those that are bolded, which indicate positives.  Constitutional: weight loss, weight gain, night sweats, fevers, chills, fatigue, weakness.  HEENT: headaches, sore throat, sneezing, nasal congestion, post nasal drip, difficulty swallowing, tooth/dental problems, visual complaints, visual changes, ear aches. Neuro: difficulty with speech, weakness, numbness, ataxia. CV:  chest pain, orthopnea, PND, swelling in lower extremities, dizziness, palpitations, syncope.  Resp: cough, hemoptysis, dyspnea, wheezing. GI: heartburn, indigestion, abdominal pain, nausea, vomiting, diarrhea, constipation, change in bowel habits, loss of appetite, hematemesis, melena, hematochezia.  GU: dysuria, change in color of urine, urgency or frequency, flank pain, hematuria. MSK: joint pain or swelling, decreased range of motion. Psych: change in mood or affect, depression, anxiety, suicidal ideations, homicidal ideations. Skin: rash, itching, bruising.  SUBJECTIVE:  Doing bronchoscopy today.  Post bronchoscopy chest x-ray shows small-moderate right pneumothorax Patient is clinically stable  VITAL SIGNS: Temp:  [98.6 F (37 C)-99 F (37.2 C)] 99 F (37.2 C) (08/23 1603) Pulse Rate:  [64-130] 64 (08/23 1535) Resp:  [15-27] 20 (08/23 1535) BP: (102-221)/(59-199) 114/59 (08/23 1530) SpO2:  [94 %-100 %] 100 % (08/23 1535)  PHYSICAL EXAMINATION: Gen:      No acute distress HEENT:  EOMI, sclera anicteric Neck:     No masses; no thyromegaly Lungs:    Bilateral crackles.  CV:         Regular rate and rhythm; no murmurs Abd:      + bowel sounds; soft, non-tender; no palpable masses, no distension Ext:    No edema; adequate peripheral  perfusion Skin:      Warm and dry; no rash Neuro: alert and oriented x 3 Psych: normal mood and affect   Recent Labs  Lab 07/08/18 0011 07/09/18 0411  NA 141 139  K 4.1 3.5  CL 107 109  CO2 25 21*  BUN 9 6  CREATININE 0.65 0.65  GLUCOSE 99 95   Recent Labs  Lab 07/08/18 0011 07/09/18 0411  HGB 13.3 12.1  HCT 41.6 37.3  WBC 8.8 8.9  PLT 235 218   Dg Chest Port 1 View  Addendum Date: 07/10/2018   ADDENDUM REPORT: 07/10/2018 14:36 ADDENDUM: Critical Value/emergent results were called by telephone at the time of interpretation on 07/10/2018 at 2:35 pm to Dr. Lavella Lemons, who verbally acknowledged these results. Electronically Signed   By: Marijo Conception, M.D.   On: 07/10/2018 14:36   Result Date: 07/10/2018 CLINICAL DATA:  Status post right sided lung biopsy.  Coughing. EXAM: PORTABLE CHEST 1 VIEW COMPARISON:  CT scan and radiographs of July 08, 2018. FINDINGS: The heart size and mediastinal contours are within normal limits. Mild to moderate size right apical pneumothorax is now noted. Multiple diffuse nodular densities remain bilaterally. The visualized skeletal structures are unremarkable. IMPRESSION: Interval development of mild to moderate size right apical pneumothorax. Electronically Signed: By: Marijo Conception, M.D. On: 07/10/2018 14:31   Dg C-arm Bronchoscopy  Result Date: 07/10/2018 C-ARM BRONCHOSCOPY: Fluoroscopy was utilized by the requesting physician.  No radiographic interpretation.    SIGNIFICANT EVENTS  8/21 Admit 8/23 Bronch, pneumothorax  STUDIES:  8/21 CT chest >> 1. Innumerable pulmonary nodules throughout both lungs, majority subcentimeter, however dominant nodule in the right upper lobe measures 2.3 x 2.2 cm. Etiology is indeterminate, favor atypical infection such as fungal, additional etiologies such as inflammatory or underlying neoplastic processes are also considered. Consider pulmonary consultation.  Radiographic follow-up recommended. 2. Prominent  AP window node is likely reactive.  Chest x-ray 07/10/18- small-moderate pneumothorax on the right Follow-up chest x-ray 07/10/18-slight decrease in pneumothorax. I have reviewed the images personally.  CULTURES:  8/21 BC X 2 >> 8/21 Resp viral panel   Discussion:  61 yoF with 4 day hx of dry cough, nausea, headache and subjective fever found to have multiple bilateral nodular lung opacities  ASSESSMENT / PLAN: Bilateral Lung Nodules Dry cough - history provided by patient does not help clarify etiology of nodules - ddx is broad including infectious vs inflammatory vs fungal vs CTD vs less likely malignancy  P:  S/P bronch today Follow cultures, BAL, biopsy studies Serologies for noninfectious causes pending She may need a surgical lung biopsy if the lesions are persistent and tests are unrevealing.   Pneumothorax post lung biopsy P: Clinically she is stable, I have reviewed the follow-up x-rays which show pneumothorax is improving. No need for chest tube at this point We will continue to monitor closely after transfer to stepdown unit.  Continue supplemental o2 via NRB to help with resolution of pneumothorax Follow-up chest x-ray tomorrow morning.  Discussed with Dr. Floreen Comber MD [facility] 07/10/2018, 4:20 PM

## 2018-07-10 NOTE — Progress Notes (Signed)
Patient received from RRT after bronch , A&Ox4, VSS, O2 sats 100% on nonrebreather. Telemetry applied and CCMD

## 2018-07-10 NOTE — Op Note (Signed)
[facility] Cardiopulmonary Patient Name: Sandra Shepard Date: 07/10/2018 MRN: [id number] Attending MD: Marshell Garfinkel , MD Date of Birth: Aug 14, 1991 CSN: Finalized Age: 27 Admit Type: Inpatient Gender: Female Procedure:            Bronchoscopy Indications:          Interstitial lung disease Providers:            Marshell Garfinkel, MD, Alice "Alex" Sherrill RRT, RCP, Ashley Mariner RRT,RCP Referring MD:          Medicines:            Midazolam 6 mg IV, Fentanyl 622 mcg IV Complications:        No immediate complications Estimated Blood Loss: Estimated blood loss: none. Procedure:            Pre-Anesthesia Assessment:                       - A History and Physical has been performed. Patient                        meds and allergies have been reviewed. The risks and                        benefits of the procedure and the sedation options and                        risks were discussed with the patient. All questions                        were answered and informed consent was obtained.                        Patient identification and proposed procedure were                        verified prior to the procedure by the physician in the                        procedure room. Mental Status Examination: alert and                        oriented. Airway Examination: normal oropharyngeal                        airway. Respiratory Examination: clear to auscultation.                        CV Examination: RRR, no murmurs, no S3 or S4. ASA Grade                        Assessment: II - A patient with mild systemic disease.                        After reviewing the risks and benefits, the patient was                        deemed in satisfactory condition to undergo  the                        procedure. The anesthesia plan was to use moderate                        sedation / analgesia (conscious sedation). Immediately                        prior to  administration of medications, the patient was                        re-assessed for adequacy to receive sedatives. The                        heart rate, respiratory rate, oxygen saturations, blood                        pressure, adequacy of pulmonary ventilation, and                        response to care were monitored throughout the                        procedure. The physical status of the patient was                        re-assessed after the procedure.                       After obtaining informed consent, the bronchoscope was                        passed under direct vision. Throughout the procedure,                        the patient's blood pressure, pulse, and oxygen                        saturations were monitored continuously. the BF-H190                        ([id number]) Olympus Diagnostic Bronchoscope was                        introduced through the right nostril and advanced to                        the tracheobronchial tree of both lungs. Scope In: 1:50:52 PM Scope Out: 2:04:37 PM Findings:      The nasopharynx/oropharynx appears normal. The larynx appears normal.       The vocal cords appear normal. The subglottic space is normal. The       trachea is of normal caliber. The carina is sharp. The tracheobronchial       tree was examined to at least the first subsegmental level. Bronchial       mucosa and anatomy are normal; there are no endobronchial lesions, and       no secretions.      Bronchoalveolar lavage was performed in the RUL apical segment (B1) of       the lung and sent  for cell count, bacterial culture, viral smears &       culture, and fungal & AFB analysis and cytology. 190 mL of fluid were       instilled. 90 mL were returned. The return was cloudy. There were no       mucoid plugs in the return fluid. Multiple specimens were obtained and       pooled into one specimen, which was sent for analysis.      Fluoroscopy guided transbronchial  brushings of a nodule were obtained in       the apical segment of the right upper lobe and in the lateral segment of       the right middle lobe with a cytology brush. Three samples were obtained.      Transbronchial biopsies of an area of infiltration were performed in the       apical segment of the right upper lobe and in the medial segment of the       right middle lobe using alligator forceps and sent for histopathology       examination. The procedure was guided by fluoroscopy. Transbronchial       biopsy technique was selected because the sampling site was not visible       endoscopically. Five biopsy passes were performed. Five biopsy samples       were obtained. Impression:           - Interstitial lung disease                       - The airway examination was normal.                       - Bronchoalveolar lavage was performed.                       - Transbronchial brushings were obtained.                       - Transbronchial lung biopsies were performed. Moderate Sedation:      Moderate (conscious) sedation was administered by the endoscopy nurse       and supervised by the endoscopist. The following parameters were       monitored: oxygen saturation, heart rate, blood pressure, and response       to care. Total physician intraservice time was 30 minutes. Recommendation:       - Await BAL, biopsy and brushing results. Procedure Code(s):    --- Professional ---                       [phone], Bronchoscopy, rigid or flexible, including                        fluoroscopic guidance, when performed; with                        transbronchial lung biopsy(s), single lobe                       [id number], Bronchoscopy, rigid or flexible, including                        fluoroscopic guidance, when performed; with bronchial  alveolar lavage                       [id number], Bronchoscopy, rigid or flexible, including                        fluoroscopic guidance, when  performed; with brushing or                        protected brushings                       [phone], Bronchoscopy, rigid or flexible, including                        fluoroscopic guidance, when performed; with                        transbronchial lung biopsy(s), each additional lobe                        (List separately in addition to code for primary                        procedure)                       [id number], Moderate sedation services provided by the same                        physician or other qualified health care professional                        performing the diagnostic or therapeutic service that                        the sedation supports, requiring the presence of an                        independent trained observer to assist in the                        monitoring of the patient's level of consciousness and                        physiological status; initial 15 minutes of                        intraservice time, patient age 75 years or older                       [phone], Moderate sedation services provided by the same                        physician or other qualified health care professional                        performing the diagnostic or therapeutic service that                        the sedation supports, requiring the presence of an  independent trained observer to assist in the                        monitoring of the patient's level of consciousness and                        physiological status; each additional 15 minutes                        intraservice time (List separately in addition to code                        for primary service) Diagnosis Code(s):    --- Professional ---                       J84.9, Interstitial pulmonary disease, unspecified CPT copyright 2017 American Medical Association. All rights reserved. The codes documented in this report are preliminary and upon coder review may  be revised to meet current  compliance requirements. Marshell Garfinkel, MD 07/10/2018 2:24:29 PM Number of Addenda: 0

## 2018-07-10 NOTE — Progress Notes (Signed)
Video bronchoscopy performed Intervention bronchial washings Intervention bronchial brushings Intervention bronchial biopsy  Jacqulynn CadetHopper, Emerson Schreifels David RRT

## 2018-07-10 NOTE — Progress Notes (Signed)
PROGRESS NOTE   Sandra Shepard  QIH:[id number]    DOB: [birth year]    DOA: 07/07/2018  PCP: Patient, No Pcp Per   I have briefly reviewed patients previous medical [facility] in [facility].  Brief Narrative:  27 year old female with no significant past medical history, presented with approximately 1 week history of nausea, malaise, headache, subjective fevers, dry cough, nonspecific chest pain with coughing, no dyspnea.  Evaluation in ED including chest x-ray and CT chest showed innumerable pulmonary nodules concerning for atypical infection.  Empirically started on IV ceftriaxone and azithromycin.  Placed on droplet isolation.  Pulmonology was consulted and she underwent bronchoscopy 8/23 for cultures, BAL and biopsies.  Complicated by post bronchoscopy pneumothorax.  CCM moved her to stepdown unit and treated conservatively with oxygen via nonrebreather mask and pneumothorax improving.  Monitor closely.   Assessment & Plan:   Principal Problem:   Pneumonitis Active Problems:   Cough   Obesity (BMI 30.0-34.9)   Status post laparoscopic cholecystectomy   Cough with CT chest showing multiple bilateral pulmonary nodules: Unclear etiology.  No history of tobacco, sick contacts, pet exposures, recent travel out of country/TB exposure, vaping.  Empirically started on IV ceftriaxone and azithromycin.  Concern for atypical infection versus hypersensitivity reaction.  Low index of suspicion for TB.  ID consulted, discontinue droplet isolation and await bronchoscopy results.  Blood cultures x2: Negative to date.  RSV panel negative.  CRP 9.8.  ESR 58.  Procalcitonin <0.1.  ANA, RA, dsDNA, SSA, SSB, SCL 70, HIV testing: Negative.  C3 complement elevated at 173.  Pregnancy test negative.  Pulmonology was consulted and she underwent bronchoscopy 8/23 for cultures, BAL and biopsies.  Complicated by post bronchoscopy pneumothorax.  CCM moved her to stepdown unit and treated conservatively with oxygen via  nonrebreather mask and pneumothorax improving.  Monitor closely.  Follow chest x-ray in a.m.  Iatrogenic right pneumothorax: Developed post bronchoscopy.  Dr. Nyoka Cowden, radiology called me to update results.  I then discussed with Dr. Vaughan Browner, CCM who had moved patient down to stepdown unit, treated with nonrebreather mask and followed up chest x-ray which showed improving pneumothorax.  DVT prophylaxis: Lovenox Code Status: Full Family Communication: Discussed with patient's husband at bedside. Disposition: DC home pending clinical improvement and further evaluation.   Consultants:  Pulmonology Infectious disease  Procedures:  None  Antimicrobials:  IV ceftriaxone and azithromycin   Subjective: Patient was seen this morning prior to procedure.  Ongoing dry intermittent cough without much change.  No chest pain or dyspnea reported.  ROS: As above otherwise negative.  Objective:  Vitals:   07/10/18 1525 07/10/18 1530 07/10/18 1535 07/10/18 1603  BP: 114/60 (!) 114/59    Pulse: 69 88 64   Resp: _0 Temp:    99 F (37.2 C)  TempSrc:      SpO2: 100% 100% 100%   Weight:      Height:        Examination: No significant change in clinical exam from yesterday.  General exam: Pleasant young female, well-built and nourished, lying comfortably propped up in bed.  Does not look septic or toxic. Respiratory system: Slightly harsh breath sounds bilaterally but no wheezing, rhonchi or crackles. Respiratory effort normal. Cardiovascular system: S1 & S2 heard, RRR. No JVD, murmurs, rubs, gallops or clicks. No pedal edema. Gastrointestinal system: Abdomen is nondistended, soft and nontender. No organomegaly or masses felt. Normal bowel sounds heard. Central nervous system: Alert and oriented. No focal neurological  deficits. Extremities: Symmetric 5 x 5 power. Skin: No rashes, lesions or ulcers Psychiatry: Judgement and insight appear normal. Mood & affect appropriate.     Data  Reviewed: I have personally reviewed following labs and imaging studies  CBC: Recent Labs  Lab 07/08/18 0011 07/09/18 0411  WBC 8.8 8.9  NEUTROABS  --  6.9  HGB 13.3 12.1  HCT 41.6 37.3  MCV 91.4 89.9  PLT 235 786   Basic Metabolic Panel: Recent Labs  Lab 07/08/18 0011 07/09/18 0411  NA 141 139  K 4.1 3.5  CL 107 109  CO2 25 21*  GLUCOSE 99 95  BUN 9 6  CREATININE 0.65 0.65  CALCIUM 8.9 8.3*   Liver Function Tests: Recent Labs  Lab 07/09/18 0411  AST 19  ALT 14  ALKPHOS 73  BILITOT 0.8  PROT 6.8  ALBUMIN 3.2*     Recent Results (from the past 240 hour(s))  Blood culture (routine x 2)     Status: None (Preliminary result)   Collection Time: 07/08/18  4:47 AM  Result Value Ref Range Status   Specimen Description BLOOD RIGHT ANTECUBITAL  Final   Special Requests   Final    BOTTLES DRAWN AEROBIC AND ANAEROBIC Blood Culture adequate volume   Culture   Final    NO GROWTH 2 DAYS Performed at [facility], [address], [place], [place] [address]    Report Status PENDING  Incomplete  Blood culture (routine x 2)     Status: None (Preliminary result)   Collection Time: 07/08/18  4:53 AM  Result Value Ref Range Status   Specimen Description BLOOD LEFT ANTECUBITAL  Final   Special Requests   Final    BOTTLES DRAWN AEROBIC AND ANAEROBIC Blood Culture adequate volume   Culture   Final    NO GROWTH 2 DAYS Performed at [facility], [place] [address], [place], [place] [address]    Report Status PENDING  Incomplete  Respiratory Panel by PCR     Status: None   Collection Time: 07/08/18  2:35 PM  Result Value Ref Range Status   Adenovirus NOT DETECTED NOT DETECTED Final   Coronavirus 229E NOT DETECTED NOT DETECTED Final   Coronavirus HKU1 NOT DETECTED NOT DETECTED Final   Coronavirus NL63 NOT DETECTED NOT DETECTED Final   Coronavirus OC43 NOT DETECTED NOT DETECTED Final   Metapneumovirus NOT DETECTED NOT DETECTED Final   Rhinovirus / Enterovirus NOT  DETECTED NOT DETECTED Final   Influenza A NOT DETECTED NOT DETECTED Final   Influenza B NOT DETECTED NOT DETECTED Final   Parainfluenza Virus 1 NOT DETECTED NOT DETECTED Final   Parainfluenza Virus 2 NOT DETECTED NOT DETECTED Final   Parainfluenza Virus 3 NOT DETECTED NOT DETECTED Final   Parainfluenza Virus 4 NOT DETECTED NOT DETECTED Final   Respiratory Syncytial Virus NOT DETECTED NOT DETECTED Final   Bordetella pertussis NOT DETECTED NOT DETECTED Final   Chlamydophila pneumoniae NOT DETECTED NOT DETECTED Final   Mycoplasma pneumoniae NOT DETECTED NOT DETECTED Final    Comment: Performed at [facility], [place] [address], [place], [place] [address]  Culture, bal-quantitative     Status: None (Preliminary result)   Collection Time: 07/10/18  1:55 PM  Result Value Ref Range Status   Specimen Description BRONCHIAL ALVEOLAR LAVAGE  Final   Special Requests Normal  Final   Gram Stain   Final    FEW WBC PRESENT,BOTH PMN AND MONONUCLEAR NO ORGANISMS SEEN  Performed at [facility], [place] [address], [place], [place] [address]    Culture PENDING  Incomplete   Report Status PENDING  Incomplete         Radiology Studies: Dg Chest Port 1 View  Result Date: 07/10/2018 CLINICAL DATA:  Status post bronchoscopy 2 hours ago with biopsies on the right. EXAM: PORTABLE CHEST 1 VIEW COMPARISON:  Single-view of the chest earlier today. CT chest 07/08/2018. FINDINGS: Right pneumothorax seen on the study earlier today has slightly decreased in size. The apex of the right lung is now at the level of the right clavicular head. It was just below the clavicular head on the prior plain film. Hazy bilateral pulmonary opacities persist. Atelectasis on the right has improved. IMPRESSION: Slight decrease in a right pneumothorax since the study earlier today. No new abnormality. Electronically Signed   By: Inge Rise M.D.   On: 07/10/2018 16:20   Dg Chest Port 1 View  Addendum Date: 07/10/2018     ADDENDUM REPORT: 07/10/2018 14:36 ADDENDUM: Critical Value/emergent results were called by telephone at the time of interpretation on 07/10/2018 at 2:35 pm to Dr. Lavella Lemons, who verbally acknowledged these results. Electronically Signed   By: Marijo Conception, M.D.   On: 07/10/2018 14:36   Result Date: 07/10/2018 CLINICAL DATA:  Status post right sided lung biopsy.  Coughing. EXAM: PORTABLE CHEST 1 VIEW COMPARISON:  CT scan and radiographs of July 08, 2018. FINDINGS: The heart size and mediastinal contours are within normal limits. Mild to moderate size right apical pneumothorax is now noted. Multiple diffuse nodular densities remain bilaterally. The visualized skeletal structures are unremarkable. IMPRESSION: Interval development of mild to moderate size right apical pneumothorax. Electronically Signed: By: Marijo Conception, M.D. On: 07/10/2018 14:31   Dg C-arm Bronchoscopy  Result Date: 07/10/2018 C-ARM BRONCHOSCOPY: Fluoroscopy was utilized by the requesting physician.  No radiographic interpretation.        Scheduled Meds: . enoxaparin (LOVENOX) injection  40 mg Subcutaneous Q24H   Continuous Infusions: . sodium chloride 10 mL/hr at 07/10/18 1315  . azithromycin 500 mg (07/10/18 0604)  . cefTRIAXone (ROCEPHIN)  IV 1 g (07/09/18 1439)     LOS: 2 days     Vernell Leep, MD, FACP, [facility]. [facility] Pager [phone]  If 7PM-7AM, please contact night-coverage [email or web address] Password [facility] 07/10/2018, 6:28 PM

## 2018-07-11 ENCOUNTER — Inpatient Hospital Stay (HOSPITAL_COMMUNITY): Payer: BLUE CROSS/BLUE SHIELD

## 2018-07-11 DIAGNOSIS — J95811 Postprocedural pneumothorax: Secondary | ICD-10-CM

## 2018-07-11 DIAGNOSIS — J939 Pneumothorax, unspecified: Secondary | ICD-10-CM

## 2018-07-11 LAB — ACID FAST SMEAR (AFB): ACID FAST SMEAR - AFSCU2: NEGATIVE

## 2018-07-11 LAB — LEGIONELLA PNEUMOPHILA SEROGP 1 UR AG: L. pneumophila Serogp 1 Ur Ag: NEGATIVE

## 2018-07-11 MED ORDER — AZITHROMYCIN 500 MG PO TABS
500.0000 mg | ORAL_TABLET | Freq: Every day | ORAL | Status: DC
  Administered 2018-07-12: 500 mg via ORAL
  Filled 2018-07-11: qty 1

## 2018-07-11 NOTE — Progress Notes (Signed)
PHARMACIST - PHYSICIAN COMMUNICATION  CONCERNING: Antibiotic IV to Oral Route Change Policy  RECOMMENDATION: This patient is receiving azithromycin by the intravenous route.  Based on criteria approved by the Pharmacy and Therapeutics Committee, the antibiotic(s) is/are being converted to the equivalent oral dose form(s).   DESCRIPTION: These criteria include:  Patient being treated for a respiratory tract infection, urinary tract infection, cellulitis or clostridium difficile associated diarrhea if on metronidazole  The patient is not neutropenic and does not exhibit a GI malabsorption state  The patient is eating (either orally or via tube) and/or has been taking other orally administered medications for a least 24 hours  The patient is improving clinically and has a Tmax < 100.5  If you have questions about this conversion, please contact the Pharmacy Department  []  ( [phone] )  [place] []  ( [phone] )  [facility] [x]  ( [phone]  [place] []  ( [phone] )  [facility] []  ( [phone] )  [facility]   Airyonna Franklyn, PharmD Clinical Pharmacist Please check Amion for pharmacy contact number   

## 2018-07-11 NOTE — Progress Notes (Signed)
Name: Abia Monaco MRN: [id number] DOB: 1991-10-12    ADMISSION DATE:  07/07/2018 CONSULTATION DATE:  07/08/2018  REFERRING MD :  Dr. Lorin Mercy  CHIEF COMPLAINT:  Lung nodules  HISTORY OF PRESENT ILLNESS:   27 year old Hispanic, [place] speaking female with no significant PMH presenting for nausea and non productive cough, and intermittent headaches since Friday.  Reports subjective fevers, last one on Monday.   Patient is originally from [place] and has been in [place] for over 20 years and has not visited since.  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  She lives at home with her husband and three children.   Denies pets, bird exposures, recent travel, other sick contacts in the home, chills, night sweats, joint pains, rash, or dysphagia.  Denies known mold exposures, jail time or TB exposure.  Had childhood vaccinations that she knows of.  No known significant family history.  Never smoker or second-hand exposure, no ETOH use, or drug use, specifically IVDA.   In the ED, patient had unremarkable labs, hemodynamically stable and saturations > 94% on room air.  CXR was abnormal showing multiple bilateral nodular opacities therefore a CT chest was performed which showed numerable, mainly subcentimeter bilaterally, with a dominant nodule in the RUL.  She was placed on airborne precautions and admitted by [facility].  PCCM consulted for further recommendations and testing.   SUBJECTIVE:  No events overnight, c/o dry mouth due to 100% NRB  VITAL SIGNS: Temp:  [97.7 F (36.5 C)-99.1 F (37.3 C)] 98.3 F (36.8 C) (08/24 1241) Pulse Rate:  [64-125] 74 (08/24 1242) Resp:  [14-27] 19 (08/24 1242) BP: (98-221)/(59-199) 108/69 (08/24 1241) SpO2:  [94 %-100 %] 100 % (08/24 1242)  PHYSICAL EXAMINATION: Gen: Well appearing, NAD HEENT: [place]/AT, PERRL, EOM-I and MMM Neck:  -Thyromegally, supple Lungs: CTA bilaterally CV: RRR, Nl S1/S2 and -M/R/G Abd: Soft, NT, ND and +BS Ext: -edema and -tenderness Skin: Intact Neuro: Alert and oriented x3, moving all ext to command  Recent Labs  Lab 07/08/18 0011 07/09/18 0411  NA 141 139  K 4.1 3.5  CL 107 109  CO2 25 21*  BUN 9 6  CREATININE 0.65 0.65  GLUCOSE 99 95   Recent Labs  Lab 07/08/18 0011 07/09/18 0411  HGB 13.3 12.1  HCT 41.6 37.3  WBC 8.8 8.9  PLT 235 218   Dg Chest 2 View  Result Date: 07/11/2018 CLINICAL DATA:  Pneumothorax following lung biopsy. EXAM: CHEST - 2 VIEW COMPARISON:  07/10/2018 FINDINGS: Right pneumothorax is getting smaller. Patchy bilateral pulmonary opacities persist, with more focal volume loss in the left lower lobe. IMPRESSION: Right pneumothorax is getting smaller. Widespread lung densities persist, with increased atelectasis in the left lower lobe. Electronically Signed   By: Nelson Chimes M.D.   On: 07/11/2018 08:49   Dg Chest Port 1 View  Result Date: 07/10/2018 CLINICAL DATA:  Status post bronchoscopy 2 hours ago with biopsies on the right. EXAM: PORTABLE CHEST 1 VIEW COMPARISON:  Single-view of the chest earlier today. CT chest 07/08/2018. FINDINGS: Right pneumothorax seen on the study earlier today has slightly decreased in size. The apex of the right lung is now at the level of the right clavicular head. It was just below the clavicular head on the prior plain film. Hazy bilateral pulmonary opacities persist. Atelectasis on the right has  improved. IMPRESSION: Slight decrease in a right pneumothorax since the study earlier today. No new abnormality. Electronically Signed   By: Inge Rise M.D.   On: 07/10/2018 16:20   Dg Chest Port 1 View  Addendum Date: 07/10/2018   ADDENDUM REPORT: 07/10/2018 14:36 ADDENDUM: Critical Value/emergent results were called by telephone at the time of interpretation on 07/10/2018 at 2:35 pm to Dr. Lavella Lemons, who verbally acknowledged these  results. Electronically Signed   By: Marijo Conception, M.D.   On: 07/10/2018 14:36   Result Date: 07/10/2018 CLINICAL DATA:  Status post right sided lung biopsy.  Coughing. EXAM: PORTABLE CHEST 1 VIEW COMPARISON:  CT scan and radiographs of July 08, 2018. FINDINGS: The heart size and mediastinal contours are within normal limits. Mild to moderate size right apical pneumothorax is now noted. Multiple diffuse nodular densities remain bilaterally. The visualized skeletal structures are unremarkable. IMPRESSION: Interval development of mild to moderate size right apical pneumothorax. Electronically Signed: By: Marijo Conception, M.D. On: 07/10/2018 14:31   Dg C-arm Bronchoscopy  Result Date: 07/10/2018 C-ARM BRONCHOSCOPY: Fluoroscopy was utilized by the requesting physician.  No radiographic interpretation.   SIGNIFICANT EVENTS  8/21 Admit 8/23 Bronch, pneumothorax  STUDIES:  8/21 CT chest >> 1. Innumerable pulmonary nodules throughout both lungs, majority subcentimeter, however dominant nodule in the right upper lobe measures 2.3 x 2.2 cm. Etiology is indeterminate, favor atypical infection such as fungal, additional etiologies such as inflammatory or underlying neoplastic processes are also considered. Consider pulmonary consultation.  Radiographic follow-up recommended. 2. Prominent AP window node is likely reactive.  Chest x-ray 07/10/18- small-moderate pneumothorax on the right Follow-up chest x-ray 07/10/18-slight decrease in pneumothorax. I have reviewed the images personally.  CULTURES:  8/21 BC X 2 >> 8/21 Resp viral panel   I reviewed CXR myself, PTX improving  Discussion:  69 yoF with 4 day hx of dry cough, nausea, headache and subjective fever found to have multiple bilateral nodular lung opacities  ASSESSMENT / PLAN: Bilateral Lung Nodules Dry cough - history provided by patient does not help clarify etiology of nodules - ddx is broad including infectious vs inflammatory vs  fungal vs CTD vs less likely malignancy  P:  Follow cultures, BAL, biopsy studies F/U on non-infectious serologies She may need a surgical lung biopsy if the lesions are persistent and tests are unrevealing.   Pneumothorax post lung biopsy P: No CT at this point NRB CXR in AM Monitor closely  Discussed with PCCM-NP  Rush Farmer, M.D. [facility] Pulmonary/Critical Care Medicine. Pager: [phone]. After hours pager: [phone].

## 2018-07-11 NOTE — Progress Notes (Signed)
PROGRESS NOTE   Sandra Shepard  KHT:[id number]    DOB: 15-Nov-1991    DOA: 07/07/2018  PCP: Patient, No Pcp Per   I have briefly reviewed patients previous medical [facility].  Brief Narrative:  27 year old female with no significant past medical history, presented with approximately 1 week history of nausea, malaise, headache, subjective fevers, dry cough, nonspecific chest pain with coughing, no dyspnea.  Evaluation in ED including chest x-ray and CT chest showed innumerable pulmonary nodules concerning for atypical infection.  Empirically started on IV ceftriaxone and azithromycin.  Placed on droplet isolation.  Pulmonology was consulted and she underwent bronchoscopy 8/23 for cultures, BAL and biopsies.  Complicated by post bronchoscopy pneumothorax.  CCM moved her to stepdown unit and treated conservatively with oxygen via nonrebreather mask and pneumothorax improving.  Monitor closely.   Assessment & Plan:   Principal Problem:   Pneumonitis Active Problems:   Cough   Obesity (BMI 30.0-34.9)   Status post laparoscopic cholecystectomy   Cough with CT chest showing multiple bilateral pulmonary nodules: Unclear etiology.  No history of tobacco, sick contacts, pet exposures, recent travel out of country/TB exposure, vaping.  Empirically started on IV ceftriaxone and azithromycin.  Concern for atypical infection versus hypersensitivity reaction.  Low index of suspicion for TB.  ID consulted, discontinue droplet isolation and await bronchoscopy results.  Blood cultures x2: Negative to date.  RSV panel negative.  CRP 9.8.  ESR 58.  Procalcitonin <0.1.  ANA, RA, dsDNA, SSA, SSB, SCL 70, HIV testing: Negative.  C3 complement elevated at 173.  Pregnancy test negative.  Pulmonology was consulted and she underwent bronchoscopy 8/23 for cultures, BAL and biopsies.  Complicated by post bronchoscopy pneumothorax.  CCM moved her to stepdown unit and treated conservatively with oxygen via  nonrebreather mask and pneumothorax improving.  Follow-up chest x-ray 8/23, personally reviewed and right-sided pneumothorax continues to get smaller.  Pulmonology following, no chest tube at this time, continue NRB and follow chest x-ray in a.m.  Iatrogenic right pneumothorax: Developed post bronchoscopy.  Dr. Nyoka Cowden, radiology called me to update results.  I then discussed with Dr. Vaughan Browner, CCM on 8/23 who had moved patient down to stepdown unit, treated with nonrebreather mask and followed up chest x-ray which showed improving pneumothorax.  As above.  DVT prophylaxis: Lovenox Code Status: Full Family Communication: Discussed with patient's husband at bedside. Disposition: DC home pending clinical improvement and further evaluation.   Consultants:  Pulmonology Infectious disease  Procedures:  None  Antimicrobials:  IV ceftriaxone and azithromycin   Subjective: Ongoing dry cough.  Reports she had some pleuritic right-sided chest pain postprocedure yesterday but none today.  No other complaints reported.  ROS: As above otherwise negative.  Objective:  Vitals:   07/11/18 0808 07/11/18 1125 07/11/18 1241 07/11/18 1242  BP: 105/72  108/69   Pulse: 64  74 74  Resp: _0 Temp: 98 F (36.7 C)  98.3 F (36.8 C)   TempSrc: Axillary  Axillary   SpO2: 100%  100% 100%  Weight:      Height:        Examination:   General exam: Pleasant young female, well-built and nourished, lying comfortably propped up in bed.  Does not appear in any distress. Respiratory system: Coloration anteriorly.  Slightly harsh posteriorly.  No wheezing, rhonchi or crackles.  No increased work of breathing. Cardiovascular system: S1 & S2 heard, RRR. No JVD, murmurs, rubs, gallops or clicks. No pedal edema.  Telemetry: Sinus rhythm. Gastrointestinal system: Abdomen is nondistended, soft and nontender. No organomegaly or masses felt. Normal bowel sounds heard.  Stable Central nervous system: Alert and  oriented. No focal neurological deficits.  Stable Extremities: Symmetric 5 x 5 power. Skin: No rashes, lesions or ulcers Psychiatry: Judgement and insight appear normal. Mood & affect appropriate.     Data Reviewed: I have personally reviewed following labs and imaging studies  CBC: Recent Labs  Lab 07/08/18 0011 07/09/18 0411  WBC 8.8 8.9  NEUTROABS  --  6.9  HGB 13.3 12.1  HCT 41.6 37.3  MCV 91.4 89.9  PLT 235 500   Basic Metabolic Panel: Recent Labs  Lab 07/08/18 0011 07/09/18 0411  NA 141 139  K 4.1 3.5  CL 107 109  CO2 25 21*  GLUCOSE 99 95  BUN 9 6  CREATININE 0.65 0.65  CALCIUM 8.9 8.3*   Liver Function Tests: Recent Labs  Lab 07/09/18 0411  AST 19  ALT 14  ALKPHOS 73  BILITOT 0.8  PROT 6.8  ALBUMIN 3.2*     Recent Results (from the past 240 hour(s))  Blood culture (routine x 2)     Status: None (Preliminary result)   Collection Time: 07/08/18  4:47 AM  Result Value Ref Range Status   Specimen Description BLOOD RIGHT ANTECUBITAL  Final   Special Requests   Final    BOTTLES DRAWN AEROBIC AND ANAEROBIC Blood Culture adequate volume   Culture   Final    NO GROWTH 3 DAYS Performed at [facility], [address] [place], [place] [address]    Report Status PENDING  Incomplete  Blood culture (routine x 2)     Status: None (Preliminary result)   Collection Time: 07/08/18  4:53 AM  Result Value Ref Range Status   Specimen Description BLOOD LEFT ANTECUBITAL  Final   Special Requests   Final    BOTTLES DRAWN AEROBIC AND ANAEROBIC Blood Culture adequate volume   Culture   Final    NO GROWTH 3 DAYS Performed at [facility], [place] [address], [place], [place] [address]    Report Status PENDING  Incomplete  Respiratory Panel by PCR     Status: None   Collection Time: 07/08/18  2:35 PM  Result Value Ref Range Status   Adenovirus NOT DETECTED NOT DETECTED Final   Coronavirus 229E NOT DETECTED NOT DETECTED Final   Coronavirus HKU1 NOT  DETECTED NOT DETECTED Final   Coronavirus NL63 NOT DETECTED NOT DETECTED Final   Coronavirus OC43 NOT DETECTED NOT DETECTED Final   Metapneumovirus NOT DETECTED NOT DETECTED Final   Rhinovirus / Enterovirus NOT DETECTED NOT DETECTED Final   Influenza A NOT DETECTED NOT DETECTED Final   Influenza B NOT DETECTED NOT DETECTED Final   Parainfluenza Virus 1 NOT DETECTED NOT DETECTED Final   Parainfluenza Virus 2 NOT DETECTED NOT DETECTED Final   Parainfluenza Virus 3 NOT DETECTED NOT DETECTED Final   Parainfluenza Virus 4 NOT DETECTED NOT DETECTED Final   Respiratory Syncytial Virus NOT DETECTED NOT DETECTED Final   Bordetella pertussis NOT DETECTED NOT DETECTED Final   Chlamydophila pneumoniae NOT DETECTED NOT DETECTED Final   Mycoplasma pneumoniae NOT DETECTED NOT DETECTED Final    Comment: Performed at [facility], [place] [address], [place], [place] [address]  Culture, bal-quantitative     Status: Abnormal (Preliminary result)   Collection Time: 07/10/18  1:55 PM  Result Value Ref Range Status  Specimen Description BRONCHIAL ALVEOLAR LAVAGE  Final   Special Requests Normal  Final   Gram Stain   Final    FEW WBC PRESENT,BOTH PMN AND MONONUCLEAR NO ORGANISMS SEEN    Culture (A)  Final    10,000 COLONIES/mL Consistent with normal respiratory flora. Performed at [facility], [place] [address], [place], [place] [address]    Report Status PENDING  Incomplete         Radiology Studies: Dg Chest 2 View  Result Date: 07/11/2018 CLINICAL DATA:  Pneumothorax following lung biopsy. EXAM: CHEST - 2 VIEW COMPARISON:  07/10/2018 FINDINGS: Right pneumothorax is getting smaller. Patchy bilateral pulmonary opacities persist, with more focal volume loss in the left lower lobe. IMPRESSION: Right pneumothorax is getting smaller. Widespread lung densities persist, with increased atelectasis in the left lower lobe. Electronically Signed   By: Nelson Chimes M.D.   On: 07/11/2018 08:49   Dg  Chest Port 1 View  Result Date: 07/10/2018 CLINICAL DATA:  Status post bronchoscopy 2 hours ago with biopsies on the right. EXAM: PORTABLE CHEST 1 VIEW COMPARISON:  Single-view of the chest earlier today. CT chest 07/08/2018. FINDINGS: Right pneumothorax seen on the study earlier today has slightly decreased in size. The apex of the right lung is now at the level of the right clavicular head. It was just below the clavicular head on the prior plain film. Hazy bilateral pulmonary opacities persist. Atelectasis on the right has improved. IMPRESSION: Slight decrease in a right pneumothorax since the study earlier today. No new abnormality. Electronically Signed   By: Inge Rise M.D.   On: 07/10/2018 16:20   Dg Chest Port 1 View  Addendum Date: 07/10/2018   ADDENDUM REPORT: 07/10/2018 14:36 ADDENDUM: Critical Value/emergent results were called by telephone at the time of interpretation on 07/10/2018 at 2:35 pm to Dr. Lavella Lemons, who verbally acknowledged these results. Electronically Signed   By: Marijo Conception, M.D.   On: 07/10/2018 14:36   Result Date: 07/10/2018 CLINICAL DATA:  Status post right sided lung biopsy.  Coughing. EXAM: PORTABLE CHEST 1 VIEW COMPARISON:  CT scan and radiographs of July 08, 2018. FINDINGS: The heart size and mediastinal contours are within normal limits. Mild to moderate size right apical pneumothorax is now noted. Multiple diffuse nodular densities remain bilaterally. The visualized skeletal structures are unremarkable. IMPRESSION: Interval development of mild to moderate size right apical pneumothorax. Electronically Signed: By: Marijo Conception, M.D. On: 07/10/2018 14:31   Dg C-arm Bronchoscopy  Result Date: 07/10/2018 C-ARM BRONCHOSCOPY: Fluoroscopy was utilized by the requesting physician.  No radiographic interpretation.        Scheduled Meds: . [START ON 07/12/2018] azithromycin  500 mg Oral Daily  . enoxaparin (LOVENOX) injection  40 mg Subcutaneous Q24H    Continuous Infusions: . sodium chloride Stopped (07/11/18 0648)  . cefTRIAXone (ROCEPHIN)  IV Stopped (07/10/18 0604)     LOS: 3 days     Vernell Leep, MD, FACP, [facility]. [facility] Pager [phone]  If 7PM-7AM, please contact night-coverage [email or web address] Password [facility] 07/11/2018, 2:29 PM

## 2018-07-11 NOTE — Progress Notes (Signed)
[facility] for Infectious Disease   Reason for visit: Follow up on pulmonary nodules  Interval History: s/p pneumothorax from BAL; some dry cough and asking for something to suppress it, on facemask, afebrile   Physical Exam: Constitutional:  Vitals:   07/11/18 1241 07/11/18 1242  BP: 108/69   Pulse: 74 74  Resp: 14 19  Temp: 98.3 F (36.8 C)   SpO2: 100% 100%   patient appears in NAD, breathing comfortably on mask Eyes: anicteric HENT: no thrush Respiratory: Normal respiratory effort; CTA B Cardiovascular: RRR GI: soft, nt, nd  Review of Systems: Constitutional: negative for fevers and chills Respiratory: positive for cough, negative for sputum Gastrointestinal: negative for nausea and diarrhea Musculoskeletal: negative for myalgias and arthralgias  Lab Results  Component Value Date   WBC 8.9 07/09/2018   HGB 12.1 07/09/2018   HCT 37.3 07/09/2018   MCV 89.9 07/09/2018   PLT 218 07/09/2018    Lab Results  Component Value Date   CREATININE 0.65 07/09/2018   BUN 6 07/09/2018   NA 139 07/09/2018   K 3.5 07/09/2018   CL 109 07/09/2018   CO2 21 (L) 07/09/2018    Lab Results  Component Value Date   ALT 14 07/09/2018   AST 19 07/09/2018   ALKPHOS 73 07/09/2018     Microbiology: Recent Results (from the past 240 hour(s))  Blood culture (routine x 2)     Status: None (Preliminary result)   Collection Time: 07/08/18  4:47 AM  Result Value Ref Range Status   Specimen Description BLOOD RIGHT ANTECUBITAL  Final   Special Requests   Final    BOTTLES DRAWN AEROBIC AND ANAEROBIC Blood Culture adequate volume   Culture   Final    NO GROWTH 3 DAYS Performed at [facility], [address], [place], [place] [address]    Report Status PENDING  Incomplete  Blood culture (routine x 2)     Status: None (Preliminary result)   Collection Time: 07/08/18  4:53 AM  Result Value Ref Range Status   Specimen Description BLOOD LEFT ANTECUBITAL  Final   Special  Requests   Final    BOTTLES DRAWN AEROBIC AND ANAEROBIC Blood Culture adequate volume   Culture   Final    NO GROWTH 3 DAYS Performed at [facility], [place] [address] [place], [place] [address]    Report Status PENDING  Incomplete  Respiratory Panel by PCR     Status: None   Collection Time: 07/08/18  2:35 PM  Result Value Ref Range Status   Adenovirus NOT DETECTED NOT DETECTED Final   Coronavirus 229E NOT DETECTED NOT DETECTED Final   Coronavirus HKU1 NOT DETECTED NOT DETECTED Final   Coronavirus NL63 NOT DETECTED NOT DETECTED Final   Coronavirus OC43 NOT DETECTED NOT DETECTED Final   Metapneumovirus NOT DETECTED NOT DETECTED Final   Rhinovirus / Enterovirus NOT DETECTED NOT DETECTED Final   Influenza A NOT DETECTED NOT DETECTED Final   Influenza B NOT DETECTED NOT DETECTED Final   Parainfluenza Virus 1 NOT DETECTED NOT DETECTED Final   Parainfluenza Virus 2 NOT DETECTED NOT DETECTED Final   Parainfluenza Virus 3 NOT DETECTED NOT DETECTED Final   Parainfluenza Virus 4 NOT DETECTED NOT DETECTED Final   Respiratory Syncytial Virus NOT DETECTED NOT DETECTED Final   Bordetella pertussis NOT DETECTED NOT DETECTED Final   Chlamydophila pneumoniae NOT DETECTED NOT DETECTED Final   Mycoplasma pneumoniae NOT DETECTED NOT DETECTED Final  Comment: Performed at [facility], [place] [address], [place], [place] [address]  Culture, bal-quantitative     Status: Abnormal (Preliminary result)   Collection Time: 07/10/18  1:55 PM  Result Value Ref Range Status   Specimen Description BRONCHIAL ALVEOLAR LAVAGE  Final   Special Requests Normal  Final   Gram Stain   Final    FEW WBC PRESENT,BOTH PMN AND MONONUCLEAR NO ORGANISMS SEEN    Culture (A)  Final    10,000 COLONIES/mL Consistent with normal respiratory flora. Performed at [facility], [place] [address] [place], [place] [address]    Report Status PENDING  Incomplete    Impression/Plan:  1. bliateral lung nodules - no  etiology identified to date.  Awaiting cultures from BAL.  On empiric treatment.    2.  Pneumothorax - on oxygen.

## 2018-07-12 ENCOUNTER — Inpatient Hospital Stay (HOSPITAL_COMMUNITY): Payer: BLUE CROSS/BLUE SHIELD

## 2018-07-12 ENCOUNTER — Telehealth: Payer: Self-pay | Admitting: Acute Care

## 2018-07-12 DIAGNOSIS — R0902 Hypoxemia: Secondary | ICD-10-CM

## 2018-07-12 LAB — CULTURE, BAL-QUANTITATIVE W GRAM STAIN: Special Requests: NORMAL

## 2018-07-12 LAB — CULTURE, BAL-QUANTITATIVE: CULTURE: NORMAL — AB

## 2018-07-12 NOTE — Discharge Summary (Signed)
Physician Discharge Summary  Sandra Shepard JSE:[id number] DOB: 01/30/1991  PCP: Patient, No Pcp Per  Admit date: 07/07/2018 Discharge date: 07/12/2018  Recommendations for Outpatient Follow-up:  1. Dr. Marshell Garfinkel, Pulmonology: Office will arrange outpatient early follow-up appointment to be seen with repeat chest x-ray and follow-up of bronchoscopy/biopsy results. 2. Dr. Michel Bickers, ID: Office will arrange outpatient early follow-up appointment with results of bronchoscopy/biopsy results.  Home Health: None Equipment/Devices: None  Discharge Condition: Improved and stable CODE STATUS: Full Diet recommendation: Regular diet  Discharge Diagnoses:  Principal Problem:   Pneumonitis Active Problems:   Cough   Obesity (BMI 30.0-34.9)   Status post laparoscopic cholecystectomy   Brief Summary: 27 year old female with no significant past medical history, presented with approximately 1 week history of nausea, malaise, headache, subjective fevers, dry cough, nonspecific chest pain with coughing, no dyspnea.  Evaluation in ED including chest x-ray and CT chest showed innumerable pulmonary nodules concerning for atypical infection.  Empirically started on IV ceftriaxone and azithromycin.  Placed on droplet isolation.  Pulmonology was consulted and she underwent bronchoscopy 8/23 for cultures, BAL and biopsies.  Complicated by post bronchoscopy pneumothorax.  CCM moved her to stepdown unit and treated conservatively with oxygen via nonrebreather mask and pneumothorax resolved.  Cleared by pulmonology and ID for discharge home and they will arrange outpatient follow-up with results of bronchoscopy.   Assessment & Plan:   Cough with CT chest showing multiple bilateral pulmonary nodules: Unclear etiology.  No history of tobacco, sick contacts, pet exposures, recent travel out of country/TB exposure, vaping.  Empirically started on IV ceftriaxone and azithromycin.  Concern for atypical  infection versus hypersensitivity reaction.  Low index of suspicion for TB.  ID consulted, discontinue droplet isolation and await bronchoscopy results.  Blood cultures x2: Negative to date.  RSV panel negative.  CRP 9.8.  ESR 58.  Procalcitonin <0.1.  ANA, RA, dsDNA, SSA, SSB, SCL 70, HIV testing: Negative.  C3 complement elevated at 173.  Pregnancy test negative.  Pulmonology was consulted and she underwent bronchoscopy 8/23 for cultures, BAL and biopsies.  Complicated by post bronchoscopy pneumothorax.  CCM moved her to stepdown unit and treated conservatively with oxygen via nonrebreather mask and pneumothorax steadily improved and all done today's x-ray.  ID has seen, stop antibiotics, cleared her for discharge home and will arrange outpatient follow-up.  BAL culture shows 10,000 colonies per mL consistent with normal respiratory flora and AFB smear negative.  Pulmonology has seen today, reviewed chest x-ray and confirming no pneumothorax, has been weaned off oxygen and is not hypoxic, cleared her for discharge home and will arrange outpatient follow-up.  Patient has been informed that if she has dyspnea or chest pain, she has to come back to ED for repeat chest x-ray and inform them of pneumothorax from biopsy.  She is aware.  I discussed with ID and pulmonology.  Iatrogenic right pneumothorax: Developed post bronchoscopy.    Management as indicated above.  Resolved.    Consultants:  Pulmonology Infectious disease  Procedures:  Bronchoscopy by pulmonology on 8/23.  Discharge Instructions  Discharge Instructions    Activity as tolerated - No restrictions   Complete by:  As directed    Call MD for:  difficulty breathing, headache or visual disturbances   Complete by:  As directed    Call MD for:  extreme fatigue   Complete by:  As directed    Call MD for:  persistant dizziness or light-headedness   Complete by:  As  directed    Call MD for:  severe uncontrolled pain   Complete by:   As directed    Call MD for:  temperature >100.4   Complete by:  As directed    Diet general   Complete by:  As directed        Medication List    TAKE these medications   ibuprofen 200 MG tablet Commonly known as:  ADVIL,MOTRIN Take 200 mg by mouth every 6 (six) hours as needed for moderate pain.      Follow-up Information    Marshell Garfinkel, MD. Schedule an appointment as soon as possible for a visit.   Specialty:  Pulmonary Disease Why:  Office will call with early follow-up appointment to be seen with repeat chest x-ray.  Please call the office if you do not hear from them in the next 2-3 business days. Contact information: [address] [place] [address] [phone]        Michel Bickers, MD. Schedule an appointment as soon as possible for a visit.   Specialty:  Infectious Diseases Why:  Office will call you with follow-up appointment.  Please call them if you do not hear back in 2-3 business days. Contact information: [address] [place] [address] [phone]          No Known Allergies    Procedures/Studies: Dg Chest 2 View  Result Date: 07/11/2018 CLINICAL DATA:  Pneumothorax following lung biopsy. EXAM: CHEST - 2 VIEW COMPARISON:  07/10/2018 FINDINGS: Right pneumothorax is getting smaller. Patchy bilateral pulmonary opacities persist, with more focal volume loss in the left lower lobe. IMPRESSION: Right pneumothorax is getting smaller. Widespread lung densities persist, with increased atelectasis in the left lower lobe. Electronically Signed   By: Nelson Chimes M.D.   On: 07/11/2018 08:49   Dg Chest 2 View  Result Date: 07/08/2018 CLINICAL DATA:  Dry cough EXAM: CHEST - 2 VIEW COMPARISON:  None. FINDINGS: No pleural effusion. Normal heart size. Multiple bilateral nodular opacities. Possible 2.1 cm right upper lobe nodule. No pneumothorax. IMPRESSION: Multiple bilateral nodular airspace disease, could be secondary to  diffuse atypical infection or possible pulmonary nodules. CT chest recommended to further evaluate. Electronically Signed   By: Donavan Foil M.D.   On: 07/08/2018 00:31   Ct Chest W Contrast  Result Date: 07/08/2018 CLINICAL DATA:  Cough. Central chest pain. Diffuse pulmonary nodules on radiograph. EXAM: CT CHEST WITH CONTRAST TECHNIQUE: Multidetector CT imaging of the chest was performed during intravenous contrast administration. CONTRAST:  69m OMNIPAQUE IOHEXOL 300 MG/ML  SOLN COMPARISON:  Chest radiograph earlier this day. FINDINGS: Cardiovascular: Normal heart size. No pericardial effusion. Normal thoracic aorta. Conventional branching pattern from the aortic arch. Mediastinum/Nodes: 10 mm AP window node. No other mediastinal or hilar adenopathy. Soft tissue density in the anterior mediastinum may represent residual or recurrent thymus. The esophagus is decompressed. No thyroid nodule. Lungs/Pleura: Innumerable noncalcified nodules throughout all lobes of both lungs, majority are subcentimeter in size. There is a dominant low-density nodule in the right upper lobe measuring 2.1 x 2.3 cm with peripheral air bronchograms. There are also prominent nodules in the left lung, 1.4 x 1.3 cm in the left lower lobe, as well as 1.3 x 1.3 cm lingular nodule. No pleural effusion or pulmonary edema. Trachea mainstem bronchi are patent. Upper Abdomen: No acute findings.  Prior cholecystectomy. Musculoskeletal: There are no acute or suspicious osseous abnormalities. IMPRESSION: 1. Innumerable pulmonary nodules throughout both lungs,  majority subcentimeter, however dominant nodule in the right upper lobe measures 2.3 x 2.2 cm. Etiology is indeterminate, favor atypical infection such as fungal, additional etiologies such as inflammatory or underlying neoplastic processes are also considered. Consider pulmonary consultation. Radiographic follow-up recommended. 2. Prominent AP window node is likely reactive. Electronically  Signed   By: Jeb Levering M.D.   On: 07/08/2018 03:13   Dg Chest Port 1 View  Result Date: 07/12/2018 CLINICAL DATA:  Follow-up pneumothorax EXAM: PORTABLE CHEST 1 VIEW COMPARISON:  07/11/2018 FINDINGS: Right pneumothorax continues to diminish and cannot be seen on today's radiograph. Widespread patchy and nodular pulmonary densities persist, with less atelectasis in the left lower lobe. No other change. IMPRESSION: Right pneumothorax no longer visible. Improved aeration at the left base. Electronically Signed   By: Nelson Chimes M.D.   On: 07/12/2018 08:16   Dg Chest Port 1 View  Result Date: 07/10/2018 CLINICAL DATA:  Status post bronchoscopy 2 hours ago with biopsies on the right. EXAM: PORTABLE CHEST 1 VIEW COMPARISON:  Single-view of the chest earlier today. CT chest 07/08/2018. FINDINGS: Right pneumothorax seen on the study earlier today has slightly decreased in size. The apex of the right lung is now at the level of the right clavicular head. It was just below the clavicular head on the prior plain film. Hazy bilateral pulmonary opacities persist. Atelectasis on the right has improved. IMPRESSION: Slight decrease in a right pneumothorax since the study earlier today. No new abnormality. Electronically Signed   By: Inge Rise M.D.   On: 07/10/2018 16:20   Dg Chest Port 1 View  Addendum Date: 07/10/2018   ADDENDUM REPORT: 07/10/2018 14:36 ADDENDUM: Critical Value/emergent results were called by telephone at the time of interpretation on 07/10/2018 at 2:35 pm to Dr. Lavella Lemons, who verbally acknowledged these results. Electronically Signed   By: Marijo Conception, M.D.   On: 07/10/2018 14:36   Result Date: 07/10/2018 CLINICAL DATA:  Status post right sided lung biopsy.  Coughing. EXAM: PORTABLE CHEST 1 VIEW COMPARISON:  CT scan and radiographs of July 08, 2018. FINDINGS: The heart size and mediastinal contours are within normal limits. Mild to moderate size right apical pneumothorax is now  noted. Multiple diffuse nodular densities remain bilaterally. The visualized skeletal structures are unremarkable. IMPRESSION: Interval development of mild to moderate size right apical pneumothorax. Electronically Signed: By: Marijo Conception, M.D. On: 07/10/2018 14:31   Dg C-arm Bronchoscopy  Result Date: 07/10/2018 C-ARM BRONCHOSCOPY: Fluoroscopy was utilized by the requesting physician.  No radiographic interpretation.      Subjective: Patient interviewed and examined with spouse at bedside.  Overall feels much better.  No chest pain or dyspnea.  Indicates that even her cough has significantly improved and almost resolved.  Denied any other complaints.  Discharge Exam:  Vitals:   07/11/18 2344 07/12/18 0427 07/12/18 0819 07/12/18 1315  BP: 109/67 92/64 119/84 105/72  Pulse: 77 64 70 72  Resp: _0 Temp: 98.8 F (37.1 C) 98.2 F (36.8 C) 98.5 F (36.9 C) 98.3 F (36.8 C)  TempSrc: Oral Oral Oral Oral  SpO2: 98% 99% 99% 98%  Weight:      Height:         General exam: Pleasant young female, well-built and nourished, lying comfortably propped up in bed.  Does not appear in any distress. Respiratory system:  Clear to auscultation.  No increased work of breathing. Cardiovascular system: S1 & S2 heard, RRR. No JVD, murmurs,  rubs, gallops or clicks. No pedal edema.  Telemetry: Sinus rhythm. Gastrointestinal system: Abdomen is nondistended, soft and nontender. No organomegaly or masses felt. Normal bowel sounds heard.   Central nervous system: Alert and oriented. No focal neurological deficits.   Extremities: Symmetric 5 x 5 power. Skin: No rashes, lesions or ulcers Psychiatry: Judgement and insight appear normal. Mood & affect appropriate.     The results of significant diagnostics from this hospitalization (including imaging, microbiology, ancillary and laboratory) are listed below for reference.     Microbiology: Recent Results (from the past 240 hour(s))  Blood  culture (routine x 2)     Status: None (Preliminary result)   Collection Time: 07/08/18  4:47 AM  Result Value Ref Range Status   Specimen Description BLOOD RIGHT ANTECUBITAL  Final   Special Requests   Final    BOTTLES DRAWN AEROBIC AND ANAEROBIC Blood Culture adequate volume   Culture   Final    NO GROWTH 3 DAYS Performed at [facility], [address] [place], [place] [address]    Report Status PENDING  Incomplete  Blood culture (routine x 2)     Status: None (Preliminary result)   Collection Time: 07/08/18  4:53 AM  Result Value Ref Range Status   Specimen Description BLOOD LEFT ANTECUBITAL  Final   Special Requests   Final    BOTTLES DRAWN AEROBIC AND ANAEROBIC Blood Culture adequate volume   Culture   Final    NO GROWTH 3 DAYS Performed at [facility], [place] [address], [place], [place] [address]    Report Status PENDING  Incomplete  Respiratory Panel by PCR     Status: None   Collection Time: 07/08/18  2:35 PM  Result Value Ref Range Status   Adenovirus NOT DETECTED NOT DETECTED Final   Coronavirus 229E NOT DETECTED NOT DETECTED Final   Coronavirus HKU1 NOT DETECTED NOT DETECTED Final   Coronavirus NL63 NOT DETECTED NOT DETECTED Final   Coronavirus OC43 NOT DETECTED NOT DETECTED Final   Metapneumovirus NOT DETECTED NOT DETECTED Final   Rhinovirus / Enterovirus NOT DETECTED NOT DETECTED Final   Influenza A NOT DETECTED NOT DETECTED Final   Influenza B NOT DETECTED NOT DETECTED Final   Parainfluenza Virus 1 NOT DETECTED NOT DETECTED Final   Parainfluenza Virus 2 NOT DETECTED NOT DETECTED Final   Parainfluenza Virus 3 NOT DETECTED NOT DETECTED Final   Parainfluenza Virus 4 NOT DETECTED NOT DETECTED Final   Respiratory Syncytial Virus NOT DETECTED NOT DETECTED Final   Bordetella pertussis NOT DETECTED NOT DETECTED Final   Chlamydophila pneumoniae NOT DETECTED NOT DETECTED Final   Mycoplasma pneumoniae NOT DETECTED NOT DETECTED Final    Comment: Performed at  [facility], [place] [address] [place], [place] [address]  Culture, bal-quantitative     Status: Abnormal   Collection Time: 07/10/18  1:55 PM  Result Value Ref Range Status   Specimen Description BRONCHIAL ALVEOLAR LAVAGE  Final   Special Requests Normal  Final   Gram Stain   Final    FEW WBC PRESENT,BOTH PMN AND MONONUCLEAR NO ORGANISMS SEEN    Culture (A)  Final    10,000 COLONIES/mL Consistent with normal respiratory flora. Performed at [facility], [place] [address] [place], [name] [address]    Report Status 07/12/2018 FINAL  Final  Acid Fast Smear (AFB)     Status: None   Collection Time: 07/10/18  1:55 PM  Result Value Ref  Range Status   AFB Specimen Processing Concentration  Final   Acid Fast Smear Negative  Final    Comment: (NOTE) Performed At: [facility] [place], [place] [id number] Rush Farmer MD [id number]    Source (AFB) BRONCHIAL ALVEOLAR LAVAGE  Final     Labs: CBC: Recent Labs  Lab 07/08/18 0011 07/09/18 0411  WBC 8.8 8.9  NEUTROABS  --  6.9  HGB 13.3 12.1  HCT 41.6 37.3  MCV 91.4 89.9  PLT 235 376   Basic Metabolic Panel: Recent Labs  Lab 07/08/18 0011 07/09/18 0411  NA 141 139  K 4.1 3.5  CL 107 109  CO2 25 21*  GLUCOSE 99 95  BUN 9 6  CREATININE 0.65 0.65  CALCIUM 8.9 8.3*   Liver Function Tests: Recent Labs  Lab 07/09/18 0411  AST 19  ALT 14  ALKPHOS 73  BILITOT 0.8  PROT 6.8  ALBUMIN 3.2*     I discussed in detail with patient's spouse at bedside.  Updated care and answered questions.  Time coordinating discharge: 25 minutes  SIGNED:  Vernell Leep, MD, FACP, [facility]. [facility] Pager [phone]  If 7PM-7AM, please contact night-coverage [email or web address] Password [facility] 07/12/2018, 2:52 PM

## 2018-07-12 NOTE — Plan of Care (Signed)
Patient is adequate for discharge.  

## 2018-07-12 NOTE — Progress Notes (Signed)
[facility] for Infectious Disease   Reason for visit: Follow up on pulmonary nodules  Interval History: s/p pneumothorax from BAL, now on nasal cannula.  No positive cultures.  No acute events   Physical Exam: Constitutional:  Vitals:   07/12/18 0427 07/12/18 0819  BP: 92/64 119/84  Pulse: 64 70  Resp: 19 18  Temp: 98.2 F (36.8 C) 98.5 F (36.9 C)  SpO2: 99% 99%   patient appears in NAD Respiratory: Normal respiratory effort; CTA B Cardiovascular: RRR GI: soft, nt, nd  Review of Systems: Constitutional: negative for fevers and chills Respiratory: positive for cough, negative for sputum   Lab Results  Component Value Date   WBC 8.9 07/09/2018   HGB 12.1 07/09/2018   HCT 37.3 07/09/2018   MCV 89.9 07/09/2018   PLT 218 07/09/2018    Lab Results  Component Value Date   CREATININE 0.65 07/09/2018   BUN 6 07/09/2018   NA 139 07/09/2018   K 3.5 07/09/2018   CL 109 07/09/2018   CO2 21 (L) 07/09/2018    Lab Results  Component Value Date   ALT 14 07/09/2018   AST 19 07/09/2018   ALKPHOS 73 07/09/2018     Microbiology: Recent Results (from the past 240 hour(s))  Blood culture (routine x 2)     Status: None (Preliminary result)   Collection Time: 07/08/18  4:47 AM  Result Value Ref Range Status   Specimen Description BLOOD RIGHT ANTECUBITAL  Final   Special Requests   Final    BOTTLES DRAWN AEROBIC AND ANAEROBIC Blood Culture adequate volume   Culture   Final    NO GROWTH 3 DAYS Performed at [facility], [address], [place], [place] [address]    Report Status PENDING  Incomplete  Blood culture (routine x 2)     Status: None (Preliminary result)   Collection Time: 07/08/18  4:53 AM  Result Value Ref Range Status   Specimen Description BLOOD LEFT ANTECUBITAL  Final   Special Requests   Final    BOTTLES DRAWN AEROBIC AND ANAEROBIC Blood Culture adequate volume   Culture   Final    NO GROWTH 3 DAYS Performed at [facility],  [place] [address], [place], [place] [address]    Report Status PENDING  Incomplete  Respiratory Panel by PCR     Status: None   Collection Time: 07/08/18  2:35 PM  Result Value Ref Range Status   Adenovirus NOT DETECTED NOT DETECTED Final   Coronavirus 229E NOT DETECTED NOT DETECTED Final   Coronavirus HKU1 NOT DETECTED NOT DETECTED Final   Coronavirus NL63 NOT DETECTED NOT DETECTED Final   Coronavirus OC43 NOT DETECTED NOT DETECTED Final   Metapneumovirus NOT DETECTED NOT DETECTED Final   Rhinovirus / Enterovirus NOT DETECTED NOT DETECTED Final   Influenza A NOT DETECTED NOT DETECTED Final   Influenza B NOT DETECTED NOT DETECTED Final   Parainfluenza Virus 1 NOT DETECTED NOT DETECTED Final   Parainfluenza Virus 2 NOT DETECTED NOT DETECTED Final   Parainfluenza Virus 3 NOT DETECTED NOT DETECTED Final   Parainfluenza Virus 4 NOT DETECTED NOT DETECTED Final   Respiratory Syncytial Virus NOT DETECTED NOT DETECTED Final   Bordetella pertussis NOT DETECTED NOT DETECTED Final   Chlamydophila pneumoniae NOT DETECTED NOT DETECTED Final   Mycoplasma pneumoniae NOT DETECTED NOT DETECTED Final    Comment: Performed at [facility], [place] [address], [place], [place] [address]  Culture, bal-quantitative     Status: Abnormal   Collection Time: 07/10/18  1:55 PM  Result Value Ref Range Status   Specimen Description BRONCHIAL ALVEOLAR LAVAGE  Final   Special Requests Normal  Final   Gram Stain   Final    FEW WBC PRESENT,BOTH PMN AND MONONUCLEAR NO ORGANISMS SEEN    Culture (A)  Final    10,000 COLONIES/mL Consistent with normal respiratory flora. Performed at [facility], [place] [address], [place], [place] [address]    Report Status 07/12/2018 FINAL  Final  Acid Fast Smear (AFB)     Status: None   Collection Time: 07/10/18  1:55 PM  Result Value Ref Range Status   AFB Specimen Processing Concentration  Final   Acid Fast Smear Negative  Final    Comment: (NOTE) Performed At: [facility] [place], [place] [id number] Rush Farmer MD [id number]    Source (AFB) BRONCHIAL ALVEOLAR LAVAGE  Final    Impression/Plan:  1. bliateral lung nodules - no etiology identified to date.  Awaiting cultures from BAL.   Will stop antibiotics Will arrange follow up in our clinic

## 2018-07-12 NOTE — Discharge Instructions (Signed)
Please get your medications reviewed and adjusted by your Primary MD. ° °Please request your Primary MD to go over all Hospital Tests and Procedure/Radiological results at the follow up, please get all Hospital records sent to your Prim MD by signing hospital release before you go home. ° °If you had Pneumonia of Lung problems at the Hospital: °Please get a 2 view Chest X ray done in 6-8 weeks after hospital discharge or sooner if instructed by your Primary MD. ° °If you have Congestive Heart Failure: °Please call your Cardiologist or Primary MD anytime you have any of the following symptoms:  °1) 3 pound weight gain in 24 hours or 5 pounds in 1 week  °2) shortness of breath, with or without a dry hacking cough  °3) swelling in the hands, feet or stomach  °4) if you have to sleep on extra pillows at night in order to breathe ° °Follow cardiac low salt diet and 1.5 lit/day fluid restriction. ° °If you have diabetes °Accuchecks 4 times/day, Once in AM empty stomach and then before each meal. °Log in all results and show them to your primary doctor at your next visit. °If any glucose reading is under 80 or above 300 call your primary MD immediately. ° °If you have Seizure/Convulsions/Epilepsy: °Please do not drive, operate heavy machinery, participate in activities at heights or participate in high speed sports until you have seen by Primary MD or a Neurologist and advised to do so again. ° °If you had Gastrointestinal Bleeding: °Please ask your Primary MD to check a complete blood count within one week of discharge or at your next visit. Your endoscopic/colonoscopic biopsies that are pending at the time of discharge, will also need to followed by your Primary MD. ° °Get Medicines reviewed and adjusted. °Please take all your medications with you for your next visit with your Primary MD ° °Please request your Primary MD to go over all hospital tests and procedure/radiological results at the follow up, please ask your  Primary MD to get all Hospital records sent to his/her office. ° °If you experience worsening of your admission symptoms, develop shortness of breath, life threatening emergency, suicidal or homicidal thoughts you must seek medical attention immediately by calling 911 or calling your MD immediately  if symptoms less severe. ° °You must read complete instructions/literature along with all the possible adverse reactions/side effects for all the Medicines you take and that have been prescribed to you. Take any new Medicines after you have completely understood and accpet all the possible adverse reactions/side effects.  ° °Do not drive or operate heavy machinery when taking Pain medications.  ° °Do not take more than prescribed Pain, Sleep and Anxiety Medications ° °Special Instructions: If you have smoked or chewed Tobacco  in the last 2 yrs please stop smoking, stop any regular Alcohol  and or any Recreational drug use. ° °Wear Seat belts while driving. ° °Please note °You were cared for by a hospitalist during your hospital stay. If you have any questions about your discharge medications or the care you received while you were in the hospital after you are discharged, you can call the unit and asked to speak with the hospitalist on call if the hospitalist that took care of you is not available. Once you are discharged, your primary care physician will handle any further medical issues. Please note that NO REFILLS for any discharge medications will be authorized once you are discharged, as it is imperative that you   return to your primary care physician (or establish a relationship with a primary care physician if you do not have one) for your aftercare needs so that they can reassess your need for medications and monitor your lab values.  You can reach the hospitalist office at phone [phone] or fax [phone]   If you do not have a primary care physician, you can call [phone] for a physician  referral.   Neumotrax (Pneumothorax) Un neumotrax, comnmente llamado pulmn colapsado, es una afeccin en la que se filtra aire de un pulmn y se acumula en el espacio entre el pulmn y la pared torcica (espacio pleural). El aire en un neumotrax est atrapado fuera del pulmn y ocupa espacio, y esto le impide al pulmn expandirse por completo. Este problema suele aparecer rpidamente. La acumulacin de aire puede ser pequea o grande. Un neumotrax pequeo puede desaparecer solo. Cuando un neumotrax es ms grande suele necesitar tratamiento mdico y hospitalizacin. CAUSAS A veces, un neumotrax puede formarse rpidamente sin causa aparente. Las personas que tienen problemas de Pathmark Storespulmn subyacentes, en particular EPOC o enfisema, tienen un riesgo mayor de tener un neumotrax. No obstante, un neumotrax puede formarse rpidamente incluso en personas sin problemas pulmonares conocidos. Los traumatismos, cirugas, procedimientos mdicos o lesiones en la pared torcica tambin pueden provocar un neumotrax. SIGNOS Y SNTOMAS En ocasiones, el neumotrax no tiene sntomas. Si se presentan sntomas, estos pueden ser:  Journalist, newspaperDolor en el pecho.  Falta de aire.  Frecuencia respiratoria aumentada.  Color azulado en los labios o la piel (cianosis). DIAGNSTICO Por lo general, el neumotrax se diagnostica mediante una radiografa o una tomografa computada de trax. El mdico le har una historia clnica y un examen fsico para determinar por qu puede tener un neumotrax. TRATAMIENTO Un neumotrax pequeo puede desaparecer solo sin tratamiento. A veces, oxgeno extra puede colaborar para que un neumotrax pequeo desparezca con mayor rapidez. En el caso de un neumotrax ms grande o que causa sntomas, suele ser necesario un procedimiento para Dealerdrenar el aire. En algunos casos, el mdico puede drenar el aire utilizando [place]una aguja. En otros, se puede introducir un tubo pleural en el espacio pleural. Un tubo  pleural es un tubo pequeo que se coloca entre las costillas en el espacio pleural. El tubo elimina el aire extra y le permite al pulmn volver a expandirse hasta su tamao normal. Un neumotrax grande, por lo general, necesita hospitalizacin. Si existe filtracin de aire constante en el espacio pleural, es posible que el tubo pleural se deba dejar colocado durante varios das Teachers Insurance and Annuity Associationhasta solucionar el problema. En algunos casos, podra necesitarse [place] ciruga. INSTRUCCIONES PARA EL CUIDADO EN EL HOGAR  Tome solo medicamentos de venta libre o recetados, segn las indicaciones del mdico.  Si el dolor o la tos le dificultan el sueo por la noche, trate de dormir en posicin semierguida en una reposera o Progress Energyusando dos o tres Cres[place]almohadas.  Limite las actividades segn las indicaciones del mdico.  Si le haban colocado un tubo pleural y este fue retirado, consulte con su mdico cundo es el mejor momento para retirar el vendaje. Hasta que el mdico le diga que puede retirar el vendaje, no permita que se moje.  No fume. Fumar es un factor de riesgo para el neumotrax.  No viaje en avin ni nade bajo el agua hasta que su mdico le permita hacerlo.  Concurra a las consultas de control con su mdico segn las indicaciones. SOLICITE ATENCIN MDICA DE INMEDIATO SI:  Siente falta de  aire o dolor en el pecho cada vez ms intenso.  Tiene una tos que no se puede controlar con supresores.  Comienza a escupir sangre al toser.  Siente un dolor que va en aumento o que no puede controlar con los medicamentos.  Elimina moco ms espeso, (esputo) de color amarillo o verde.  Tiene enrojecimiento, dolor cada vez ms intenso o Administrator, arts donde se coloc el tubo pleural (en caso de que se haya tratado el neumotrax con un tubo pleural).  Se abre el lugar donde se coloc el tubo pleural.  Siente que sale aire del lugar donde se coloc el tubo pleural.  Tiene fiebre o sntomas persistentes durante ms  de 2a 3das.  Tiene fiebre y los sntomas empeoran repentinamente. ASEGRESE DE QUE:  Comprende estas instrucciones.  Controlar su afeccin.  Recibir ayuda de inmediato si no mejora o si empeora. Esta informacin no tiene Theme park manager el consejo del mdico. Asegrese de hacerle al mdico cualquier pregunta que tenga. Document Released: 08/14/2005 Document Revised: 08/25/2013 Document Reviewed: 03/30/2014 Elsevier Interactive Patient Education  [employer].

## 2018-07-12 NOTE — Progress Notes (Signed)
Discharge instructions given to Sandra Shepard.  Discussed medications and side effects.  Discussed follow up appointments and activities.  Discussed signs and symptoms to watch for and when to contact the physician.  Verbalized understanding.

## 2018-07-12 NOTE — Progress Notes (Addendum)
Name: Sandra Shepard MRN: [id number] DOB: [birth year]    ADMISSION DATE:  07/07/2018 CONSULTATION DATE:  07/08/2018  REFERRING MD :  Dr. Ophelia CharterYates  CHIEF COMPLAINT:  Lung nodules  HISTORY OF PRESENT ILLNESS:   27 year old Hispanic, English speaking female with no significant PMH presenting for nausea and non productive cough, and intermittent headaches since Friday.  Reports subjective fevers, last one on Monday.   [place] from [place] and [place] been in [place] over 20 years and has not visited since.  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  She lives at home with her husband and three children.   Denies pets, bird exposures, recent travel, other sick contacts in the home, chills, night sweats, joint pains, rash, or dysphagia.  Denies known mold exposures, jail time or TB exposure.  Had childhood vaccinations that she knows of.  No known significant family history.  Never smoker or second-hand exposure, no ETOH use, or drug use, specifically IVDA.   In the ED, patient had unremarkable labs, hemodynamically stable and saturations > 94% on room air.  CXR was abnormal showing multiple bilateral nodular opacities therefore a CT chest was performed which showed numerable, mainly subcentimeter bilaterally, with a dominant nodule in the RUL.  She was placed on airborne precautions and [facility]  PCCM consulted for further recommendations and testing.   SUBJECTIVE:  No events overnight, no new complaints  VITAL SIGNS: Temp:  [98.2 F (36.8 C)-98.8 F (37.1 C)] 98.5 F (36.9 C) (08/25 0819) Pulse Rate:  [58-77] 70 (08/25 0819) Resp:  [10-20] 18 (08/25 0819) BP: (92-119)/(64-84) 119/84 (08/25 0819) SpO2:  [98 %-100 %] 99 % (08/25 0819)  PHYSICAL EXAMINATION: Gen: Well appearing, NAD HEENT: [place]/AT, PERRL, EOM-I and MMM Neck: Normal,  -JVD Lungs: CTA bilaterally CV: RRR, Nl S1/S2 and -M/R/G Abd: Soft, NT, ND and +BS Ext: -edema and -tenderness Skin: Intact Neuro: Alert and oriented x3, moving all ext to command  Recent Labs  Lab 07/08/18 0011 07/09/18 0411  NA 141 139  K 4.1 3.5  CL 107 109  CO2 25 21*  BUN 9 6  CREATININE 0.65 0.65  GLUCOSE 99 95   Recent Labs  Lab 07/08/18 0011 07/09/18 0411  HGB 13.3 12.1  HCT 41.6 37.3  WBC 8.8 8.9  PLT 235 218   Dg Chest 2 View  Result Date: 07/11/2018 CLINICAL DATA:  Pneumothorax following lung biopsy. EXAM: CHEST - 2 VIEW COMPARISON:  07/10/2018 FINDINGS: Right pneumothorax is getting smaller. Patchy bilateral pulmonary opacities persist, with more focal volume loss in the left lower lobe. IMPRESSION: Right pneumothorax is getting smaller. Widespread lung densities persist, with increased atelectasis in the left lower lobe. Electronically Signed   By: Paulina FusiMark  Shogry M.D.   On: 07/11/2018 08:49   Dg Chest Port 1 View  Result Date: 07/12/2018 CLINICAL DATA:  Follow-up pneumothorax EXAM: PORTABLE CHEST 1 VIEW COMPARISON:  07/11/2018 FINDINGS: Right pneumothorax continues to diminish and cannot be seen on today's radiograph. Widespread patchy and nodular pulmonary densities persist, with less atelectasis in the left lower lobe. No other change. IMPRESSION: Right pneumothorax no longer visible. Improved aeration at the left base. Electronically Signed   By: Paulina FusiMark  Shogry M.D.   On: 07/12/2018 08:16   Dg Chest Port 1 View  Result Date: 07/10/2018 CLINICAL DATA:  Status post  bronchoscopy 2 hours ago with biopsies on the right. EXAM: PORTABLE CHEST 1 VIEW COMPARISON:  Single-view of the chest earlier today. CT chest 07/08/2018. FINDINGS: Right pneumothorax seen on the study earlier today has slightly decreased in size. The apex of the right lung is now at the level of the right clavicular head. It was just below the clavicular head on the prior plain film. Hazy bilateral  pulmonary opacities persist. Atelectasis on the right has improved. IMPRESSION: Slight decrease in a right pneumothorax since the study earlier today. No new abnormality. Electronically Signed   By: Drusilla Kanner M.D.   On: 07/10/2018 16:20   Dg Chest Port 1 View  Addendum Date: 07/10/2018   ADDENDUM REPORT: 07/10/2018 14:36 ADDENDUM: Critical Value/emergent results were called by telephone at the time of interpretation on 07/10/2018 at 2:35 pm to Dr. Bennie Pierini, who verbally acknowledged these results. Electronically Signed   By: Lupita Raider, M.D.   On: 07/10/2018 14:36   Result Date: 07/10/2018 CLINICAL DATA:  Status post right sided lung biopsy.  Coughing. EXAM: PORTABLE CHEST 1 VIEW COMPARISON:  CT scan and radiographs of July 08, 2018. FINDINGS: The heart size and mediastinal contours are within normal limits. Mild to moderate size right apical pneumothorax is now noted. Multiple diffuse nodular densities remain bilaterally. The visualized skeletal structures are unremarkable. IMPRESSION: Interval development of mild to moderate size right apical pneumothorax. Electronically Signed: By: Lupita Raider, M.D. On: 07/10/2018 14:31   Dg C-arm Bronchoscopy  Result Date: 07/10/2018 C-ARM BRONCHOSCOPY: Fluoroscopy was utilized by the requesting physician.  No radiographic interpretation.   SIGNIFICANT EVENTS  8/21 Admit 8/23 Bronch, pneumothorax  STUDIES:  8/21 CT chest >> 1. Innumerable pulmonary nodules throughout both lungs, majority subcentimeter, however dominant nodule in the right upper lobe measures 2.3 x 2.2 cm. Etiology is indeterminate, favor atypical infection such as fungal, additional etiologies such as inflammatory or underlying neoplastic processes are also considered. Consider pulmonary consultation.  Radiographic follow-up recommended. 2. Prominent AP window node is likely reactive.  Chest x-ray 07/10/18- small-moderate pneumothorax on the right Follow-up chest x-ray  07/10/18-slight decrease in pneumothorax. I have reviewed the images personally.  CULTURES:  8/21 BC X 2 >> 8/21 Resp viral panel   I reviewed CXR myself, no PTX noted  Discussion:  40 yoF with 4 day hx of dry cough, nausea, headache and subjective fever found to have multiple bilateral nodular lung opacities.  Discussed with [facility]-MD  ASSESSMENT / PLAN: Bilateral Lung Nodules Dry cough - history provided by patient does not help clarify etiology of nodules - ddx is broad including infectious vs inflammatory vs fungal vs CTD vs less likely malignancy  P:  F/U on bronch results as outpatient,  F/U on non-infectious serologies F/u with Dr. Isaiah Serge as outpatient, message left to office staff to call on Monday and make apt for f/u.  Pneumothorax post lung biopsy P: D/C O2 No further CXR needed at this point Patient informed if she had SOB to come back to the ED for a repeat CXR and inform EDP of PTX from Bx  Hypoxemia: Titrate O2 to off.  PCCM will sign off, please call back if needed.  Alyson Reedy, M.D. [facility]. Pager: [phone]. After hours pager: [phone]

## 2018-07-12 NOTE — Plan of Care (Signed)
Care plans reviewed and patient is progressing.  

## 2018-07-12 NOTE — Telephone Encounter (Signed)
This patient was discharged over the weekend. She needs a follow up appointment ( post bronch Dr. Isaiah SergeMannam ) asap with Mannam . If nothing available with Mannam then NP with follow up Mannam at his first available. Will need CXR prior to visit. Thanks so much MotorolaElise

## 2018-07-13 ENCOUNTER — Other Ambulatory Visit: Payer: Self-pay | Admitting: Pulmonary Disease

## 2018-07-13 DIAGNOSIS — J939 Pneumothorax, unspecified: Secondary | ICD-10-CM

## 2018-07-13 LAB — HYPERSENSITIVITY PNEUMONITIS
A. FUMIGATUS #1 ABS: NEGATIVE
A. PULLULANS ABS: NEGATIVE
MICROPOLYSPORA FAENI IGG: NEGATIVE
PIGEON SERUM ABS: NEGATIVE
THERMOACT. SACCHARII: NEGATIVE
THERMOACTINOMYCES VULGARIS IGG: NEGATIVE

## 2018-07-13 LAB — CULTURE, BLOOD (ROUTINE X 2)
CULTURE: NO GROWTH
CULTURE: NO GROWTH
SPECIAL REQUESTS: ADEQUATE
SPECIAL REQUESTS: ADEQUATE

## 2018-07-13 LAB — QUANTIFERON-TB GOLD PLUS: QuantiFERON-TB Gold Plus: UNDETERMINED

## 2018-07-13 LAB — PNEUMOCYSTIS JIROVECI SMEAR BY DFA: PNEUMOCYSTIS JIROVECI AG: NEGATIVE

## 2018-07-13 LAB — QUANTIFERON-TB GOLD PLUS (RQFGPL)
QUANTIFERON NIL VALUE: 0.12 [IU]/mL
QUANTIFERON TB2 AG VALUE: 0.13 [IU]/mL
QuantiFERON Mitogen Value: 0.22 IU/mL
QuantiFERON TB1 Ag Value: 0.13 IU/mL

## 2018-07-14 ENCOUNTER — Encounter (HOSPITAL_COMMUNITY): Payer: Self-pay | Admitting: Pulmonary Disease

## 2018-07-14 NOTE — Telephone Encounter (Signed)
Pt already has HFU with Dr. Isaiah SergeMannam on 9/13 with CXR per result note from 8.23.2019, Dr. Isaiah SergeMannam requested pt come in specifically to see him on this date.   Will forward to Sarah as Lorain ChildesFYI.

## 2018-07-15 ENCOUNTER — Telehealth: Payer: Self-pay | Admitting: Pulmonary Disease

## 2018-07-15 ENCOUNTER — Inpatient Hospital Stay: Payer: Self-pay | Admitting: Nurse Practitioner

## 2018-07-15 NOTE — Telephone Encounter (Signed)
Patient in office requesting a letter to go back to work. Patient was seen by MD Mannam in hospital. Letter printed and signed by MD Mannam. Nothing further needed at this time.

## 2018-07-27 ENCOUNTER — Ambulatory Visit (INDEPENDENT_AMBULATORY_CARE_PROVIDER_SITE_OTHER): Payer: Self-pay | Admitting: Internal Medicine

## 2018-07-27 ENCOUNTER — Encounter: Payer: Self-pay | Admitting: Internal Medicine

## 2018-07-27 DIAGNOSIS — J189 Pneumonia, unspecified organism: Secondary | ICD-10-CM

## 2018-07-27 NOTE — Assessment & Plan Note (Signed)
No causes been found for her recent pulmonary infiltrates.  She is improving clinically.  She is scheduled to follow-up with pulmonology at the end of this week and have a chest x-ray done.  She will follow-up here in 4 weeks.

## 2018-07-27 NOTE — Progress Notes (Signed)
[facility]  Patient Active Problem List   Diagnosis Date Noted  . Pneumonitis 07/09/2018    Priority: High  . Obesity (BMI 30.0-34.9) 07/09/2018  . Status post laparoscopic cholecystectomy 07/09/2018  . Cough 07/08/2018    Patient's Medications  New Prescriptions   No medications on file  Previous Medications   IBUPROFEN (ADVIL,MOTRIN) 200 MG TABLET    Take 200 mg by mouth every 6 (six) hours as needed for moderate pain.  Modified Medications   No medications on file  Discontinued Medications   No medications on file    Subjective: [name] is in for her hospital follow-up visit.  She was hospitalized several weeks ago with nausea, subjective fevers and dry cough.  Chest x-ray and CT scan revealed diffuse nodular infiltrates.  She was afebrile throughout her hospitalization.  Blood cultures were negative.  Respiratory virus panel was negative.  Legionella and pneumococcal urinary antigens were negative.  She had a fairly exhaustive work-up for autoimmune disorders that was nondiagnostic.  She underwent bronchoscopy.  Routine culture showed only normal flora.  AFB and fungal stains were negative and those cultures are negative so far.  Her QuantiFERON TB assay was negative.  Her biopsy showed only benign bronchial tissue with fibrinous exudate.  There was no significant inflammation.  She received 5 days of empiric azithromycin and ceftriaxone before discharge.  She is feeling much better now.  She has not had any fever, chills or sweats.  She has not had any cough or shortness of breath.  Her appetite is improving.  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  She has had some mid back pain since starting back to work.  Review of Systems: Review of Systems  Constitutional: Negative for chills, diaphoresis, fever, malaise/fatigue and weight loss.  HENT: Negative for sore throat.   Respiratory: Negative for cough, sputum production and  shortness of breath.   Cardiovascular: Negative for chest pain.  Gastrointestinal: Negative for abdominal pain, diarrhea, heartburn, nausea and vomiting.  Genitourinary: Negative for dysuria and frequency.  Musculoskeletal: Positive for back pain. Negative for joint pain and myalgias.  Skin: Negative for rash.  Neurological: Negative for dizziness and headaches.    No past medical history on file.  Social History   Tobacco Use  . Smoking status: Never Smoker  . Smokeless tobacco: Never Used  Substance Use Topics  . Alcohol use: No  . Drug use: No    Family History  Problem Relation Age of Onset  . Diabetes Mother   . Cancer Neg Hx     No Known Allergies  Objective: Vitals:   07/27/18 1132  BP: 109/73  Pulse: 62  Temp: 98.3 F (36.8 C)  TempSrc: Oral   There is no height or weight on file to calculate BMI.  Physical Exam  Constitutional: She is oriented to person, place, and time.  She is smiling and in good spirits.  Cardiovascular: Normal rate, regular rhythm and normal heart sounds.  No murmur heard. Pulmonary/Chest: Effort normal and breath sounds normal. She has no wheezes. She has no rales.  Abdominal: Soft. There is no tenderness.  Neurological: She is alert and oriented to person, place, and time.  Skin: No rash noted.  Psychiatric: She has a normal mood and affect.    Lab Results    Problem List Items Addressed This Visit      High   Pneumonitis  No causes been found for her recent pulmonary infiltrates.  She is improving clinically.  She is scheduled to follow-up with pulmonology at the end of this week and have a chest x-ray done.  She will follow-up here in 4 weeks.          Cliffton Asters, MD [facility] [phone] pager   [phone] cell 07/27/2018, 12:02 PM

## 2018-07-31 ENCOUNTER — Encounter: Payer: Self-pay | Admitting: Pulmonary Disease

## 2018-07-31 ENCOUNTER — Ambulatory Visit (INDEPENDENT_AMBULATORY_CARE_PROVIDER_SITE_OTHER): Payer: Self-pay | Admitting: Pulmonary Disease

## 2018-07-31 ENCOUNTER — Ambulatory Visit (INDEPENDENT_AMBULATORY_CARE_PROVIDER_SITE_OTHER)
Admission: RE | Admit: 2018-07-31 | Discharge: 2018-07-31 | Disposition: A | Discharge status: Home / Self Care | Payer: Self-pay | Source: Ambulatory Visit | Attending: Pulmonary Disease | Admitting: Pulmonary Disease

## 2018-07-31 VITALS — BP 112/70 | HR 61 | Ht 61.0 in | Wt 161.8 lb

## 2018-07-31 DIAGNOSIS — J939 Pneumothorax, unspecified: Secondary | ICD-10-CM

## 2018-07-31 DIAGNOSIS — J189 Pneumonia, unspecified organism: Secondary | ICD-10-CM

## 2018-07-31 DIAGNOSIS — R918 Other nonspecific abnormal finding of lung field: Secondary | ICD-10-CM

## 2018-07-31 NOTE — Patient Instructions (Signed)
I am glad you are feeling better Your chest x-ray looks much improved with no lung abnormalities which is good news The results of the bronchoscopy were all negative I will follow back with you in 1 year with a chest x-ray Please call us sooner if there is any change in his symptoms.

## 2018-07-31 NOTE — Progress Notes (Signed)
Sandra Shepard    [id number]    1991/05/19  Primary Care Physician:Patient, No Pcp Per  Referring Physician: No referring provider defined for this encounter.  Chief complaint: Follow-up for abnormal CT scan, pulmonary nodules.  HPI: 27 year old Hispanic, English speaking female with no significant PMH seen at [facility] in August 2019 for cough, abnormal chest x-ray.  PCCM consulted.  She was treated with antibiotics, underwent bronchoscopy to rule out AFB.  Procedure complicated by small pneumothorax which resolved spontaneously.  Patient is originally from [place] and has been in [place] for over 20 years and has not visited since.  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  She lives at home with her husband and three children.   Denies pets, bird exposures, recent travel, other sick contacts in the home, chills, night sweats, joint pains, rash, or dysphagia.  Denies known mold exposures, jail time or TB exposure.  Had childhood vaccinations that she knows of.  No known significant family history.  Never smoker or second-hand exposure, no ETOH use, or drug use, specifically IVDA.   Interim History: Patient is here for follow-up  States that breathing is doing well with improvement in cough.  No complaints today.  Physical Exam: Blood pressure 112/70, pulse 61, height _0  (1.549 m), weight 161 lb 12.8 oz (73.4 kg), last menstrual period 07/19/2018, SpO2 97 %. Gen:      No acute distress HEENT:  EOMI, sclera anicteric Neck:     No masses; no thyromegaly Lungs:    Clear to auscultation bilaterally; normal respiratory effort CV:         Regular rate and rhythm; no murmurs Abd:      + bowel sounds; soft, non-tender; no palpable masses, no distension Ext:    No edema; adequate peripheral perfusion Skin:      Warm and dry; no rash Neuro:  alert and oriented x 3 Psych: normal mood and affect  Data Reviewed: Imaging: CT chest 07/08/2018-numerous bilateral pulmonary nodules, dominant nodule in the right upper lobe measures 2.3 cm.  Prominent AP window node.  Chest x-ray 07/10/18- small-moderate pneumothorax on the right  Chest x-ray 07/31/2018- resolution of pneumothorax, lungs are now clear with resolution of pulmonary nodules. I reviewed the images personally.   Labs: Urinary strep pneumo, Legionella 8/22-negative Serologies 07/08/2018- ANA, angiotensin-converting enzyme, CCP, double-stranded DNA, rheumatoid factor, ANCA,[place], SCL 70-negative C3 173, C4 27 Hyper sensitivity profile 07/08/1989-negative  Bronchoscopy 07/10/2018 Cell count-WBC 680, 39% lymphs, 4% eos, 38% neutrophils, 19% monocyte macrophage BAL cultures, AFB, fungus-negative for normal respiratory flora. CD4:CD8 ratio 2.9:1 Path-benign bronchial tissue with fibrinous exudate.  Assessment:  Bilateral pulmonary nodules, abnormal CT Likely secondary to community-acquired pneumonia.  All work-up including connective tissue disease serologies, bronchoscopy has been negative Symptoms have improved with antibiotics and x-ray today is clear We will continue monitoring her.  I have recommended a follow-up CT in 3 months to make sure were not missing anything  Plan/Recommendations: - Follow up CT in 3 months.  Outpatient Encounter Medications as of 07/31/2018  Medication Sig  . ibuprofen (ADVIL,MOTRIN) 200 MG tablet Take 200 mg by mouth every 6 (six) hours as needed for moderate pain.   No facility-administered encounter medications on file as of 07/31/2018.     Allergies as of 07/31/2018  . (No  Known Allergies)    No past medical history on file.  Past Surgical History:  Procedure Laterality Date  . CHOLECYSTECTOMY N/A 06/26/2016   Procedure: LAPAROSCOPIC CHOLECYSTECTOMY WITH INTRAOPERATIVE CHOLANGIOGRAM;  Surgeon: Greer Pickerel, MD;  Location: [facility];   Service: General;  Laterality: N/A;  . VIDEO BRONCHOSCOPY Bilateral 07/10/2018   Procedure: VIDEO BRONCHOSCOPY WITH FLUORO;  Surgeon: Marshell Garfinkel, MD;  Location: [facility];  Service: Cardiopulmonary;  Laterality: Bilateral;    Family History  Problem Relation Age of Onset  . Diabetes Mother   . Cancer Neg Hx     Social History   Socioeconomic History  . Marital status: Married    Spouse name: Not on file  . Number of children: Not on file  . Years of education: Not on file  . Highest education level: Not on file  Occupational History  . 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 Financial resource strain: Not on file  . Food insecurity:    Worry: Not on file    Inability: Not on file  . Transportation needs:    Medical: Not on file    Non-medical: Not on file  Tobacco Use  . Smoking status: Never Smoker  . Smokeless tobacco: Never Used  Substance and Sexual Activity  . Alcohol use: No  . Drug use: No  . Sexual activity: Not on file  Lifestyle  . Physical activity:    Days per week: Not on file    Minutes per session: Not on file  . Stress: Not on file  Relationships  . Social connections:    Talks on phone: Not on file    Gets together: Not on file    Attends religious service: Not on file    Active member of club or organization: Not on file    Attends meetings of clubs or organizations: Not on file    Relationship status: Not on file  . Intimate partner violence:    Fear of current or ex partner: Not on file    Emotionally abused: Not on file    Physically abused: Not on file    Forced sexual activity: Not on file  Other Topics Concern  . Not on file  Social History Narrative  . Not on file    Review of systems: Review of Systems  Constitutional: Negative for fever and chills.  HENT: Negative.   Eyes: Negative for blurred vision.  Respiratory: as per HPI  Cardiovascular: Negative for chest pain and palpitations.  Gastrointestinal: Negative for  vomiting, diarrhea, blood per rectum. Genitourinary: Negative for dysuria, urgency, frequency and hematuria.  Musculoskeletal: Negative for myalgias, back pain and joint pain.  Skin: Negative for itching and rash.  Neurological: Negative for dizziness, tremors, focal weakness, seizures and loss of consciousness.  Endo/Heme/Allergies: Negative for environmental allergies.  Psychiatric/Behavioral: Negative for depression, suicidal ideas and hallucinations.  All other systems reviewed and are negative.  Marshell Garfinkel MD [facility] 07/31/2018, 2:12 PM  CC: No ref. provider found

## 2018-08-07 LAB — FUNGUS CULTURE WITH STAIN

## 2018-08-07 LAB — FUNGUS CULTURE RESULT

## 2018-08-07 LAB — FUNGAL ORGANISM REFLEX

## 2018-08-21 ENCOUNTER — Telehealth: Payer: Self-pay | Admitting: Pulmonary Disease

## 2018-08-21 NOTE — Telephone Encounter (Signed)
FMLA forms have been given to Dr. Isaiah Serge to be complete.

## 2018-08-23 LAB — ACID FAST CULTURE WITH REFLEXED SENSITIVITIES (MYCOBACTERIA): Acid Fast Culture: NEGATIVE

## 2018-08-23 LAB — ACID FAST CULTURE WITH REFLEXED SENSITIVITIES

## 2018-08-25 NOTE — Telephone Encounter (Signed)
FMLA forms have been completed and returned to [place]. Nothing further is needed.

## 2018-08-25 NOTE — Telephone Encounter (Signed)
Dr. Isaiah Serge please advise if you have completed this paperwork. Thanks.

## 2018-08-25 NOTE — Telephone Encounter (Signed)
Rec'd completed paperwork back - fwd to [employer] via interoffice mail -pr  °

## 2018-08-27 ENCOUNTER — Encounter: Payer: Self-pay | Admitting: Internal Medicine

## 2018-08-27 ENCOUNTER — Ambulatory Visit (INDEPENDENT_AMBULATORY_CARE_PROVIDER_SITE_OTHER): Payer: Self-pay | Admitting: Internal Medicine

## 2018-08-27 ENCOUNTER — Telehealth: Payer: Self-pay | Admitting: Pulmonary Disease

## 2018-08-27 DIAGNOSIS — J189 Pneumonia, unspecified organism: Secondary | ICD-10-CM

## 2018-08-27 NOTE — Telephone Encounter (Signed)
Went and spoke with patient.  She said just waiting on Dr. Isaiah Serge to sign paper work per [employer].  After talking with Delray Alt, Dr. Isaiah Serge signed it today and paperwork was handed to [facility] to fax back.   Nothing further needed at this time.

## 2018-08-27 NOTE — Assessment & Plan Note (Signed)
She had community-acquired pneumonia of unknown cause.  Fortunately it resolved with 5 days of antibiotics at that time.

## 2018-08-27 NOTE — Progress Notes (Signed)
[facility]  Patient Active Problem List   Diagnosis Date Noted  . Community acquired pneumonia 07/09/2018    Priority: High  . Obesity (BMI 30.0-34.9) 07/09/2018  . Status post laparoscopic cholecystectomy 07/09/2018  . Cough 07/08/2018    Patient's Medications  New Prescriptions   No medications on file  Previous Medications   IBUPROFEN (ADVIL,MOTRIN) 200 MG TABLET    Take 200 mg by mouth every 6 (six) hours as needed for moderate pain.  Modified Medications   No medications on file  Discontinued Medications   No medications on file    Subjective: [name] is in for her hospital follow-up visit.  She was hospitalized several weeks ago with nausea, subjective fevers and dry cough.  Chest x-ray and CT scan revealed diffuse nodular infiltrates.  She was afebrile throughout her hospitalization.  Blood cultures were negative.  Respiratory virus panel was negative.  Legionella and pneumococcal urinary antigens were negative.  She had a fairly exhaustive work-up for autoimmune disorders that was nondiagnostic.  She underwent bronchoscopy.  Routine culture showed only normal flora.  AFB and fungal stains were negative and those cultures are negative so far.  Her QuantiFERON TB assay was negative.  Her biopsy showed only benign bronchial tissue with fibrinous exudate.  There was no significant inflammation.  She received 5 days of empiric azithromycin and ceftriaxone before discharge.    She is feeling much better now.  She has not had any fever, chills or sweats.  She has not had any cough or shortness of breath.  Her appetite is improving.  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  She had a follow-up chest x-ray on 07/31/2018 which showed no active disease.  Review of Systems: Review of Systems  Constitutional: Negative for chills, diaphoresis, fever, malaise/fatigue and weight loss.  HENT: Negative for sore throat.   Respiratory:  Negative for cough, sputum production and shortness of breath.   Cardiovascular: Negative for chest pain.  Gastrointestinal: Negative for abdominal pain, diarrhea, heartburn, nausea and vomiting.  Genitourinary: Negative for dysuria and frequency.  Musculoskeletal: Negative for back pain, joint pain and myalgias.  Skin: Negative for rash.  Neurological: Negative for dizziness and headaches.    No past medical history on file.  Social History   Tobacco Use  . Smoking status: Never Smoker  . Smokeless tobacco: Never Used  Substance Use Topics  . Alcohol use: No  . Drug use: No    Family History  Problem Relation Age of Onset  . Diabetes Mother   . Cancer Neg Hx     No Known Allergies  Objective: Vitals:   08/27/18 1548  BP: 99/67  Pulse: 70  Resp: 20  Temp: 98.9 F (37.2 C)  TempSrc: Oral  SpO2: 98%  Weight: 164 lb (74.4 kg)  Height: 5\' 2"  (1.575 m)   Body mass index is 30 kg/m.  Physical Exam  Constitutional: She is oriented to person, place, and time.  She is smiling and in good spirits.  Cardiovascular: Normal rate, regular rhythm and normal heart sounds.  No murmur heard. Pulmonary/Chest: Effort normal and breath sounds normal. She has no wheezes. She has no rales.  Abdominal: Soft. There is no tenderness.  Neurological: She is alert and oriented to person, place, and time.  Skin: No rash noted.  Psychiatric: She has a normal mood and affect.    Lab Results    Problem  List Items Addressed This Visit      High   Community acquired pneumonia    She had community-acquired pneumonia of unknown cause.  Fortunately it resolved with 5 days of antibiotics at that time.          Cliffton Asters, MD [facility] [phone] pager   [phone] cell 08/27/2018, 4:05 PM

## 2018-09-23 ENCOUNTER — Telehealth: Payer: Self-pay | Admitting: Pulmonary Disease

## 2018-09-23 NOTE — Telephone Encounter (Signed)
Pt in office for update on FMLA forms.  Pt states she contacted [employer] and was advised that FMLA forms have not been received.  Per our records [employer] forms were completed and faxed back to [employer] on 08/25/18. Contacted [employer] for update. Per [employer] forms were completed and faxed to [employer] on 08/27/18, and original forms were mailed to pt.  lmtcb x1 to make pt aware of this information.

## 2018-09-24 NOTE — Telephone Encounter (Signed)
Called and spoke to patient, patient states she never received any papers. States [employer] the clain department is faxing over blank forms that need to be filled out today. Informed patient that PM is not back into the office until Monday. Voiced understanding. Forms obtained from fax, only 2 of 5 pages came through. Called and left message with Marland Mcalpine Informing her that the forms needed to be refaxed and that the patient would need an extension as PM is not back in the office until Monday.

## 2018-09-24 NOTE — Telephone Encounter (Signed)
Pt is calling back [phone]

## 2018-09-24 NOTE — Telephone Encounter (Signed)
Pt is returning call. Cb is [phone] Pt will be going to work at 4pm and will be unavail after that time.

## 2018-09-24 NOTE — Telephone Encounter (Signed)
LMTCB

## 2018-09-29 NOTE — Telephone Encounter (Addendum)
Called pt to see if I could get the phone number for [employer] so we could contact them.  Per pt, phone number for [employer] that needs to be contacted is [phone].  Called [employer] but was on hold for 10 minutes and never had anyone come to the phone. Will try to call back later

## 2018-09-29 NOTE — Telephone Encounter (Signed)
Sounds like [place] has tried to contact [employer] - What would need to be done would be to get the complete set of paperwork that needs to be filled out, once received we can ask Dr. Isaiah SergeMannam if he is willing to complete the paperwork without [facility]. If he agrees, he will need to fill the form out and we can fax it back to [employer], and send it to scan as normal and don't send to [employer]. If Dr. Isaiah SergeMannam is not willing to complete the form without it going through [employer] first, advise the patient and fwd to [employer] via interoffice mail or fax. Thanks - pr

## 2018-09-29 NOTE — Telephone Encounter (Signed)
Sandra Shepard, can you see if you are able to help [place] out with this or let us know what we need to do? Thanks!

## 2018-09-30 NOTE — Telephone Encounter (Signed)
I called [employer] and stayed on hold for 17 minutes trying to speak to a representative about the FMLA forms. WIll try again later.

## 2018-10-02 NOTE — Telephone Encounter (Signed)
Attempted to call [employer] but was on hold for over 10 minutes. Will try to call back later.

## 2018-10-06 NOTE — Telephone Encounter (Signed)
Called [employer] at number given, unable to reach anyone as they are closed. Will leave in triage to follow up on once they open.

## 2018-10-08 NOTE — Telephone Encounter (Signed)
Attempted to contact [employer]. They are not currently open at this time. Message will need to be followed up on in triage later this morning.

## 2018-10-12 NOTE — Telephone Encounter (Signed)
Triage will need to follow up on the message today.

## 2018-10-13 NOTE — Telephone Encounter (Signed)
[employer] is currently closed at this time. Triage will need to call back later this morning.

## 2018-10-13 NOTE — Telephone Encounter (Signed)
Called [employer]. Spoke with Clydie BraunKaren at [facility] stating to her that we were needing paperwork sent to our office so Dr. Isaiah SergeMannam could fill out for pt. Stated to her the paperwork was sent to our office but we were needing new copies to be sent to our office for MD to fill out for pt.  Per Clydie BraunKaren, she was going to have to send me to a different person with [employer] to help out with this for pt.  After being on hold for 10 minutes waiting for Clydie BraunKaren to transfer me to someone else who could help me, I hung up. Will try to call back later.

## 2018-10-14 NOTE — Telephone Encounter (Signed)
Called [employer], unable to reach anyone as I was on hold for over 10 minutes. Selected option 1 to leave a voicemail. Left message to give us a call back.

## 2018-10-19 NOTE — Telephone Encounter (Signed)
Attempted to call pt to see if everything was able to be taken care of in regards to her FMLA paperwork or if there was something else that still needed to be taken care of but unable to reach her. Left message for pt to return call.

## 2018-10-21 ENCOUNTER — Inpatient Hospital Stay: Admission: RE | Admit: 2018-10-21 | Payer: Self-pay | Source: Ambulatory Visit

## 2018-10-21 NOTE — Telephone Encounter (Signed)
Spoke with pt. She states that she does not need anything further done with her FMLA paperwork. Message will be closed at this time.

## 2018-10-22 ENCOUNTER — Other Ambulatory Visit: Payer: Self-pay

## 2018-10-22 ENCOUNTER — Ambulatory Visit (INDEPENDENT_AMBULATORY_CARE_PROVIDER_SITE_OTHER)
Admission: RE | Admit: 2018-10-22 | Discharge: 2018-10-22 | Disposition: A | Discharge status: Home / Self Care | Payer: BLUE CROSS/BLUE SHIELD | Source: Ambulatory Visit | Attending: Pulmonary Disease | Admitting: Pulmonary Disease

## 2018-10-22 DIAGNOSIS — R918 Other nonspecific abnormal finding of lung field: Secondary | ICD-10-CM

## 2018-11-02 ENCOUNTER — Other Ambulatory Visit: Payer: Self-pay

## 2018-11-02 ENCOUNTER — Emergency Department (HOSPITAL_COMMUNITY)
Admission: EM | Admit: 2018-11-02 | Discharge: 2018-11-03 | Disposition: A | Discharge status: Home / Self Care | Payer: BLUE CROSS/BLUE SHIELD | Attending: Emergency Medicine | Admitting: Emergency Medicine

## 2018-11-02 ENCOUNTER — Encounter (HOSPITAL_COMMUNITY): Payer: Self-pay | Admitting: Emergency Medicine

## 2018-11-02 DIAGNOSIS — R1084 Generalized abdominal pain: Secondary | ICD-10-CM | POA: Insufficient documentation

## 2018-11-02 DIAGNOSIS — Z5321 Procedure and treatment not carried out due to patient leaving prior to being seen by health care provider: Secondary | ICD-10-CM | POA: Diagnosis not present

## 2018-11-02 LAB — URINALYSIS, ROUTINE W REFLEX MICROSCOPIC
BILIRUBIN URINE: NEGATIVE
Glucose, UA: NEGATIVE mg/dL
Ketones, ur: NEGATIVE mg/dL
NITRITE: NEGATIVE
Protein, ur: NEGATIVE mg/dL
SPECIFIC GRAVITY, URINE: 1.021 (ref 1.005–1.030)
pH: 7 (ref 5.0–8.0)

## 2018-11-02 NOTE — ED Triage Notes (Signed)
Pt. Stated, I only have pain when I mash on it or bend down

## 2018-11-02 NOTE — ED Triage Notes (Signed)
Pt. Stated, Ive had belly pain since last wed. Denies ay other symptoms

## 2018-11-03 NOTE — ED Notes (Signed)
Pt. States "im leaving ive already waited 8 hours".

## 2021-04-26 ENCOUNTER — Telehealth: Payer: Self-pay

## 2021-04-26 NOTE — Telephone Encounter (Signed)
Numerous attempts have been made to contact patient to schedule a BCCCP appointment per [facility], received message, "voicemail box not set up. I will reach out to [facility]/Marnie Renaldo Fiddler, RN for assistance.

## 2021-05-01 ENCOUNTER — Other Ambulatory Visit (HOSPITAL_COMMUNITY): Payer: Self-pay | Admitting: Obstetrics and Gynecology

## 2021-05-01 DIAGNOSIS — R2232 Localized swelling, mass and lump, left upper limb: Secondary | ICD-10-CM

## 2021-05-11 ENCOUNTER — Ambulatory Visit: Payer: Self-pay | Admitting: *Deleted

## 2021-05-11 ENCOUNTER — Other Ambulatory Visit: Payer: Self-pay

## 2021-05-11 VITALS — BP 119/66 | Wt 159.4 lb

## 2021-05-11 DIAGNOSIS — R2232 Localized swelling, mass and lump, left upper limb: Secondary | ICD-10-CM

## 2021-05-11 NOTE — Patient Instructions (Addendum)
Explained breast self awareness with Reuben Likes. Patient did not need a Pap smear today due to last Pap smear was in April 2021 per patient.  Let her know BCCCP will cover Pap smears every 3 years unless has a history of abnormal Pap smears. Referred patient to [facility] mammography for a diagnostic mammogram. Appointment scheduled Tuesday, May 15, 2021 at 1100. Patient aware of appointment and will be there. Kaitlan Bin verbalized understanding.  Romain Erion, Kathaleen Maser, RN 10:12 AM

## 2021-05-11 NOTE — Progress Notes (Addendum)
Ms. Shani Fitch is a 30 y.o. female who presents to [facility] clinic today with complaint of left axillary lump x 2 months that has decreased in size over the past few days.    Pap Smear: Pap smear not completed today. Last Pap smear was in April 2021 at the [facility] in [place] clinic and was normal per patient. Per patient has no history of an abnormal Pap smear. Last Pap smear result is not available in Epic.   Physical exam: Breasts Breasts symmetrical. No skin abnormalities bilateral breasts. No nipple retraction bilateral breasts. No nipple discharge bilateral breasts. No lymphadenopathy. No lumps palpated right breast. Palpated a lump within the left axilla at 1 o'clock 13 cm from the nipple. No complaints of pain or tenderness on exam.     Pelvic/Bimanual Pap is not indicated today per BCCCP guidelines.   Smoking History: Patient has never smoked.   Patient Navigation: Patient education provided. Access to services provided for patient through BCCCP program.   Breast and Cervical Cancer Risk Assessment: Patient does not have family history of breast cancer, known genetic mutations, or radiation treatment to the chest before age 45. Patient does not have history of cervical dysplasia, immunocompromised, or DES exposure in-utero. Breast cancer risk assessment completed. No breast cancer risk calculated due to patient is less than 31 years old.  Risk Assessment     Risk Scores       05/11/2021   Last edited by: Meryl Dare, CMA   5-year risk:    Lifetime risk:             A: BCCCP exam without pap smear Complaint of left axillary lump.  P: Referred patient to [facility] mammography for a diagnostic mammogram. Appointment scheduled Tuesday, May 15, 2021 at 1100.  Priscille Heidelberg, RN 05/11/2021 10:12 AM

## 2021-05-15 ENCOUNTER — Ambulatory Visit (HOSPITAL_COMMUNITY)
Admission: RE | Admit: 2021-05-15 | Discharge: 2021-05-15 | Disposition: A | Discharge status: Home / Self Care | Payer: BLUE CROSS/BLUE SHIELD | Source: Ambulatory Visit | Attending: Obstetrics and Gynecology | Admitting: Obstetrics and Gynecology

## 2021-05-15 ENCOUNTER — Other Ambulatory Visit: Payer: Self-pay

## 2021-05-15 ENCOUNTER — Ambulatory Visit (HOSPITAL_COMMUNITY)
Admission: RE | Admit: 2021-05-15 | Discharge: 2021-05-15 | Disposition: A | Discharge status: Home / Self Care | Payer: Self-pay | Source: Ambulatory Visit | Attending: Obstetrics and Gynecology | Admitting: Obstetrics and Gynecology

## 2021-05-15 DIAGNOSIS — R2232 Localized swelling, mass and lump, left upper limb: Secondary | ICD-10-CM

## 2022-02-05 IMAGING — US US BREAST*L* LIMITED INC AXILLA
1 series · 7 of 7 positions shown · non-contrast
Comparison: None.

CLINICAL DATA: 30-year-old female with a palpable area of concern
in the left axilla.

EXAM:
DIGITAL DIAGNOSTIC BILATERAL MAMMOGRAM WITH TOMOSYNTHESIS AND CAD;
ULTRASOUND LEFT BREAST LIMITED
TECHNIQUE: Bilateral digital diagnostic mammography and breast tomosynthesis
was performed. The images were evaluated with computer-aided
detection.; Targeted ultrasound examination of the left breast was
performed

[Series 1: us breast*left* limited inc axilla · 0.07mm/px · 7 of 7 slices shown]
[im 1/7]
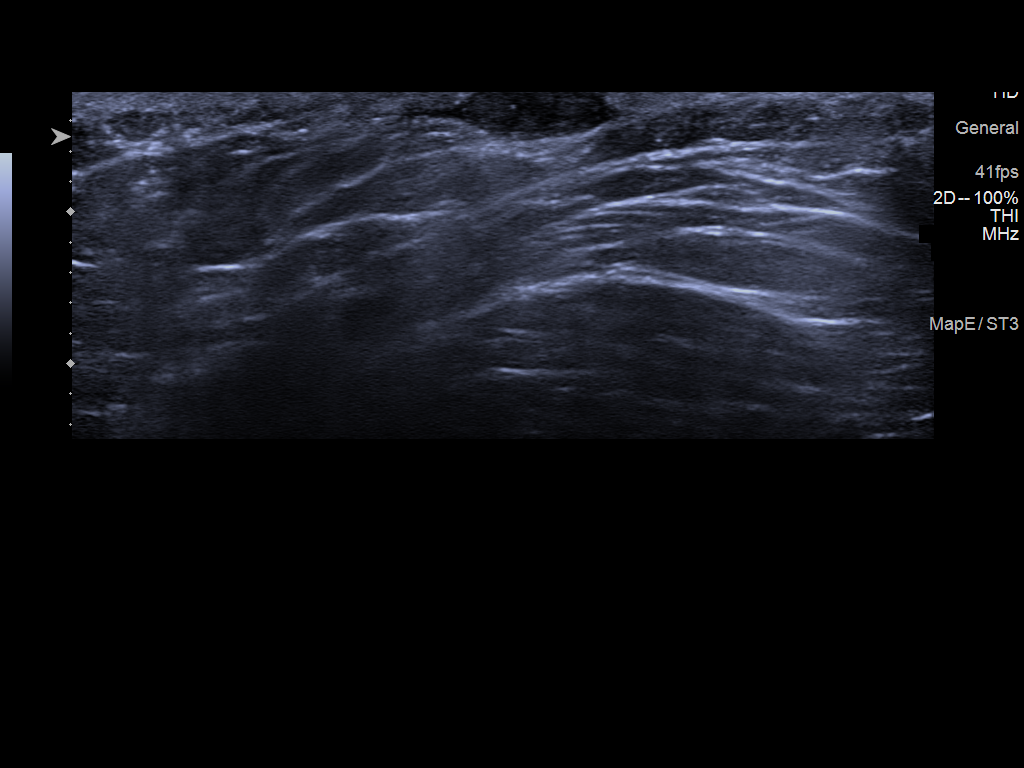
[im 2/7]
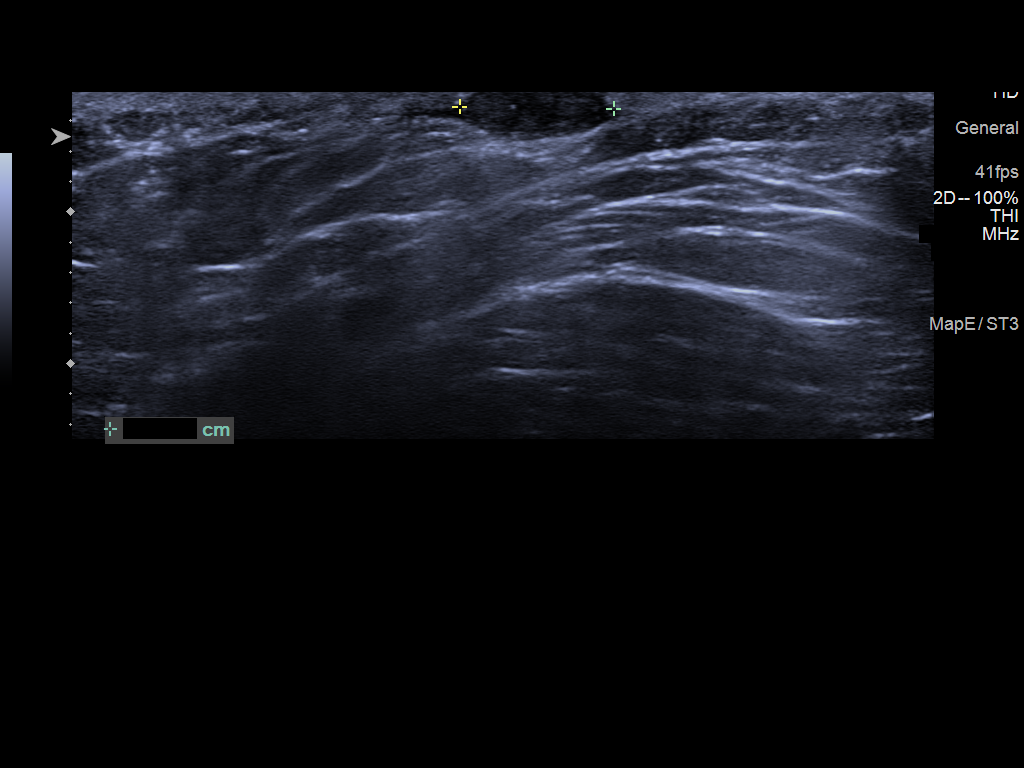
[im 3/7]
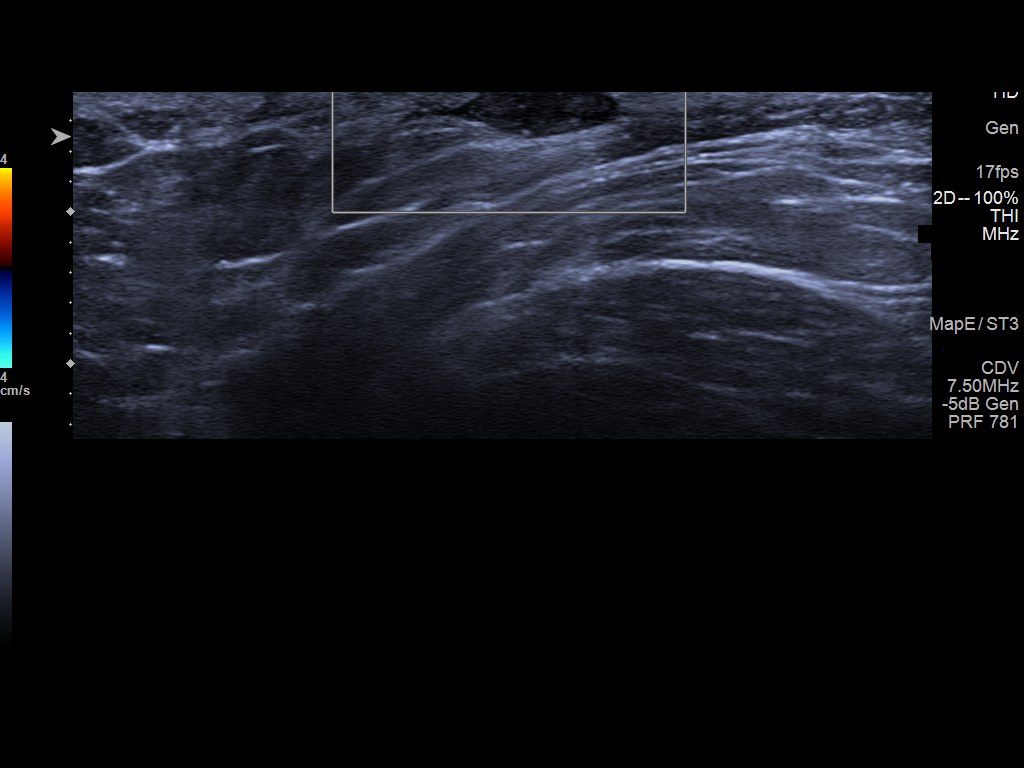
[im 4/7]
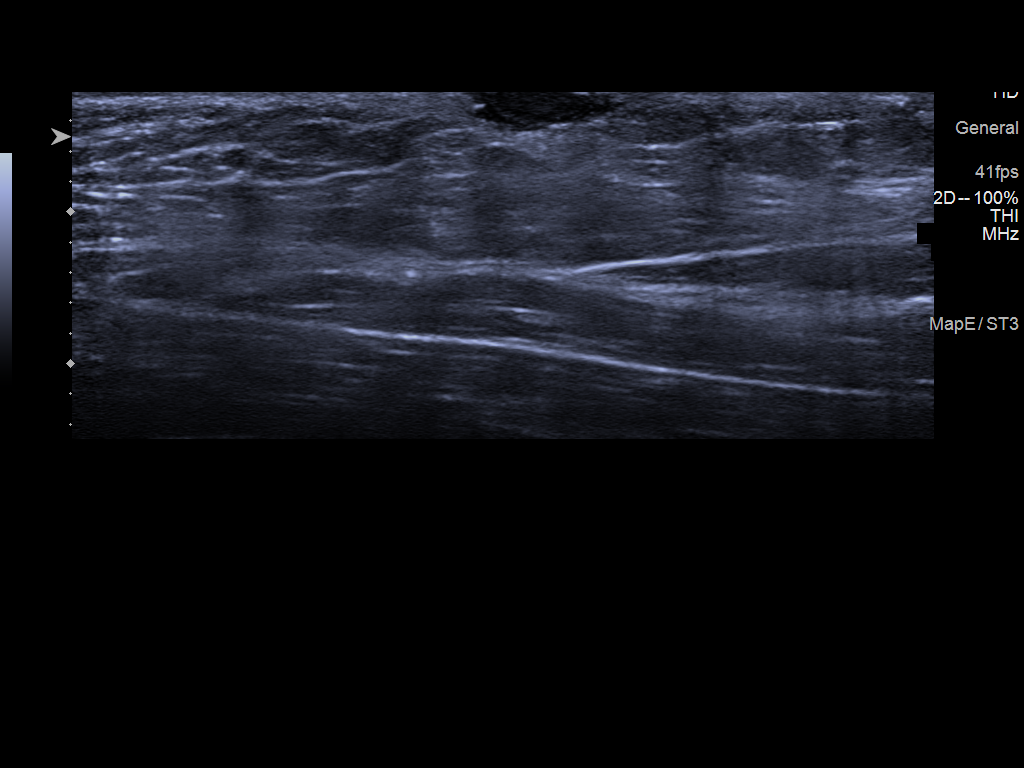
[im 5/7]
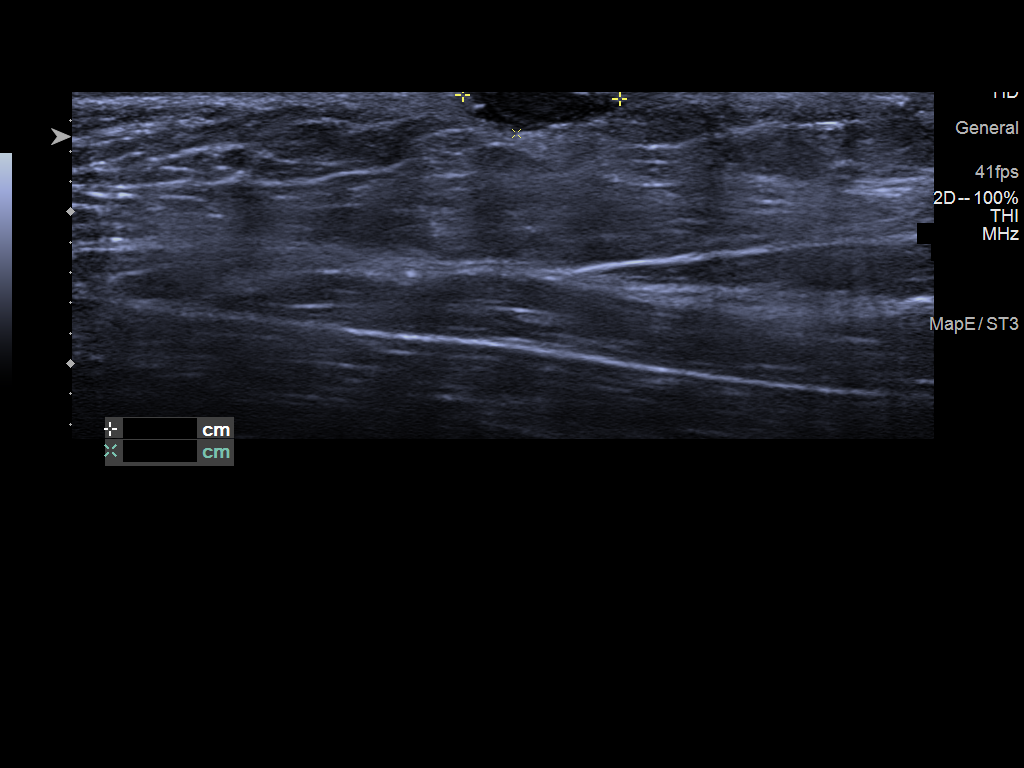
[im 6/7]
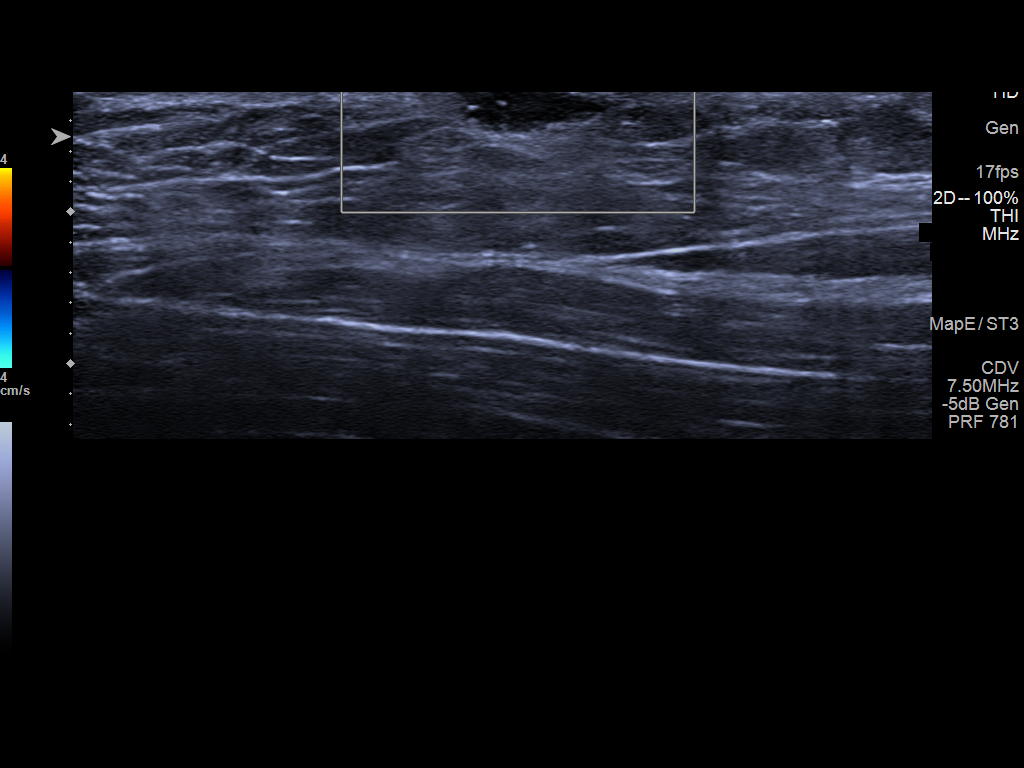
[im 7/7]
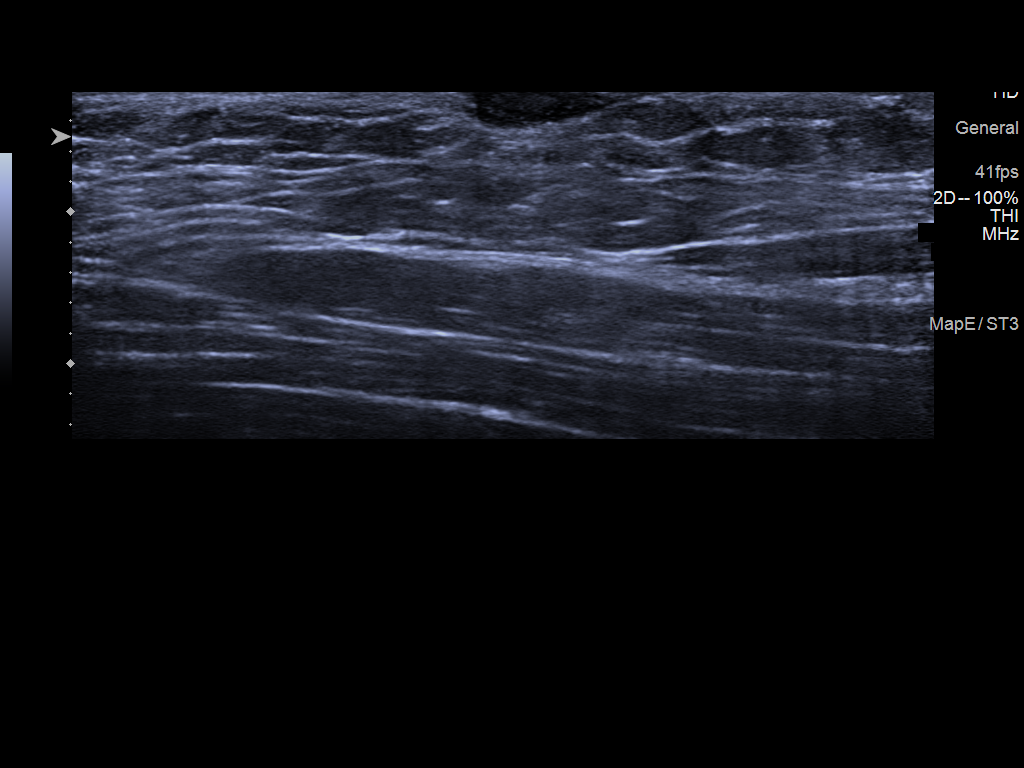

[7 of 7 positions shown; findings below may reference images not displayed]

ACR Breast Density Category c: The breast tissue is heterogeneously
dense, which may obscure small masses.
FINDINGS: No suspicious masses or calcifications are seen in either breast.
Spot compression tomograms were performed over the palpable area of
concern in the left axilla with no definite abnormality seen.

Physical examination at site of palpable concern in the left axilla
reveals a small area of thickening felt to be within the skin.

Targeted ultrasound of the left axilla was performed. There is an
oval circumscribed hypoechoic mass within the skin of the high left
axilla measuring 1 x 0.4 x 1 cm. A thin linear tract is seen
extending to the skin with findings most compatible with a benign
sebaceous cyst.
IMPRESSION: 1. Sebaceous cyst at site of palpable concern in the high left
axilla.

2.  No findings of malignancy in either breast.

RECOMMENDATION:
Screening mammogram at age 40 unless there are persistent or
intervening clinical concerns. (Code:7C-V-4SQ)

I have discussed the findings and recommendations with the patient.
If applicable, a reminder letter will be sent to the patient
regarding the next appointment.

BI-RADS CATEGORY  2: Benign.

## 2022-02-05 IMAGING — MG DIGITAL DIAGNOSTIC BILAT W/ TOMO W/ CAD
6 of 10 series · 6 of 30 positions shown · non-contrast
Comparison: None.

CLINICAL DATA: 30-year-old female with a palpable area of concern
in the left axilla.

EXAM:
DIGITAL DIAGNOSTIC BILATERAL MAMMOGRAM WITH TOMOSYNTHESIS AND CAD;
ULTRASOUND LEFT BREAST LIMITED
TECHNIQUE: Bilateral digital diagnostic mammography and breast tomosynthesis
was performed. The images were evaluated with computer-aided
detection.; Targeted ultrasound examination of the left breast was
performed

[L MLO synth-2D]
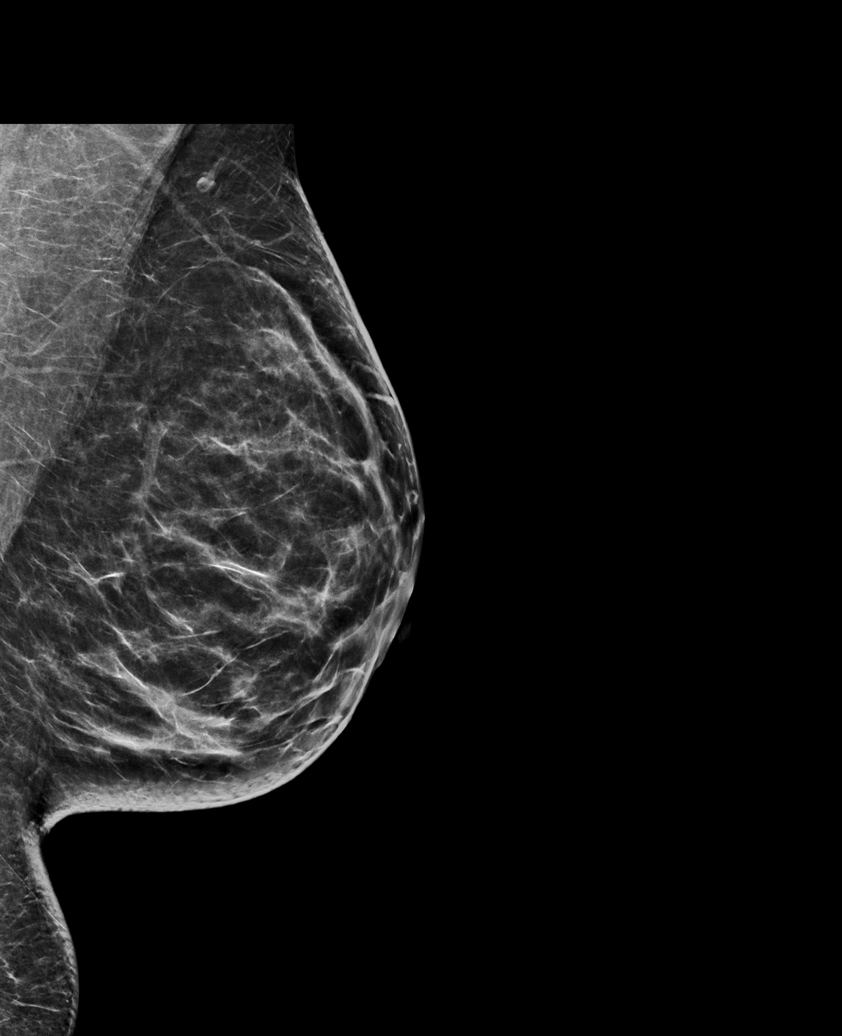

[L CC synth-2D]
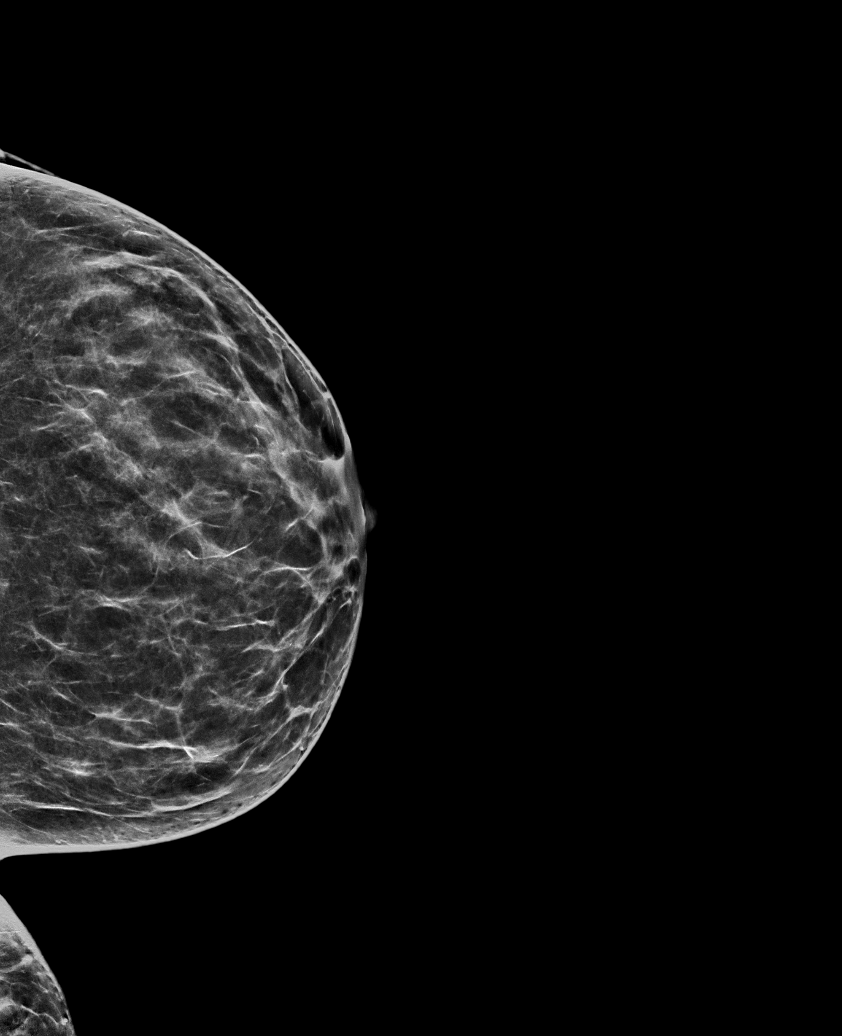

[R MLO synth-2D]
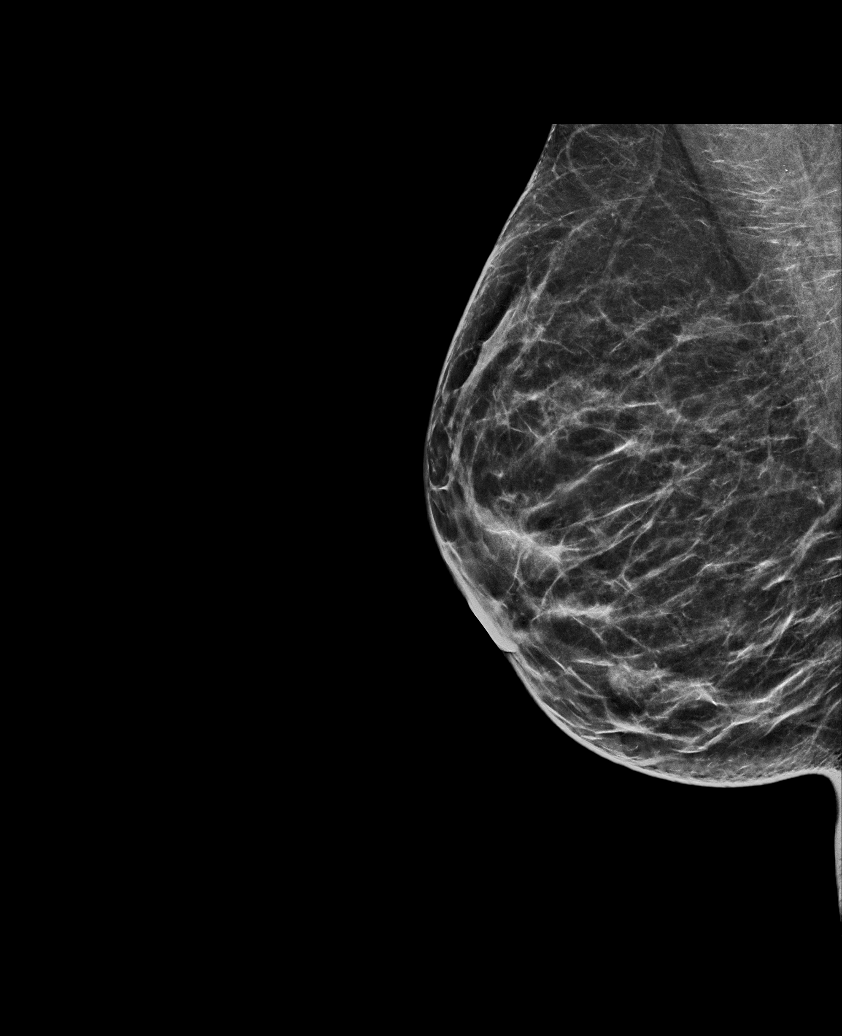

[L ML synth-2D]
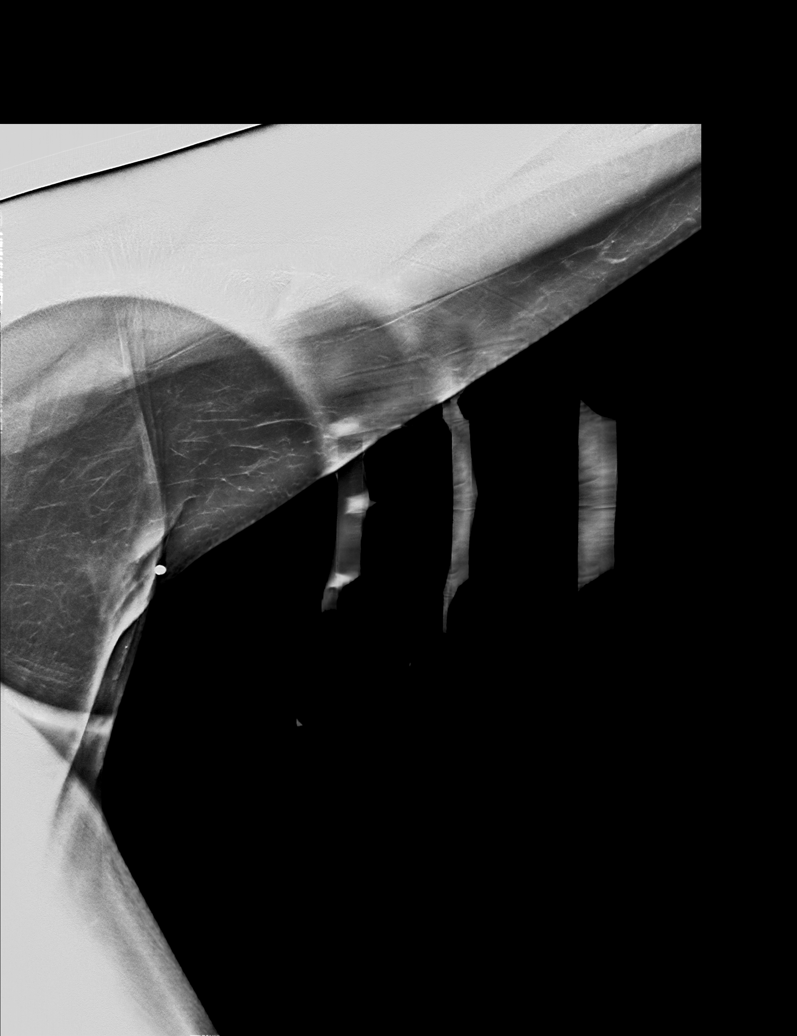

[R CC synth-2D]
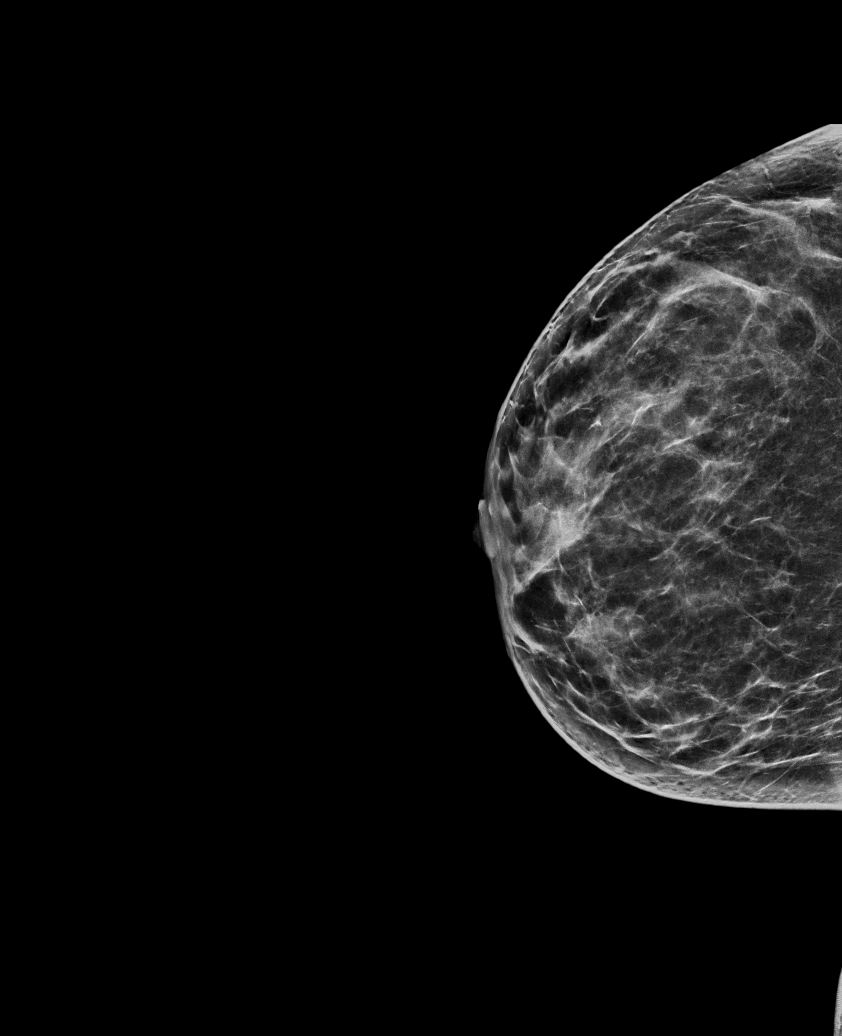

[R CC tomo · tomo slice 35/69.0]
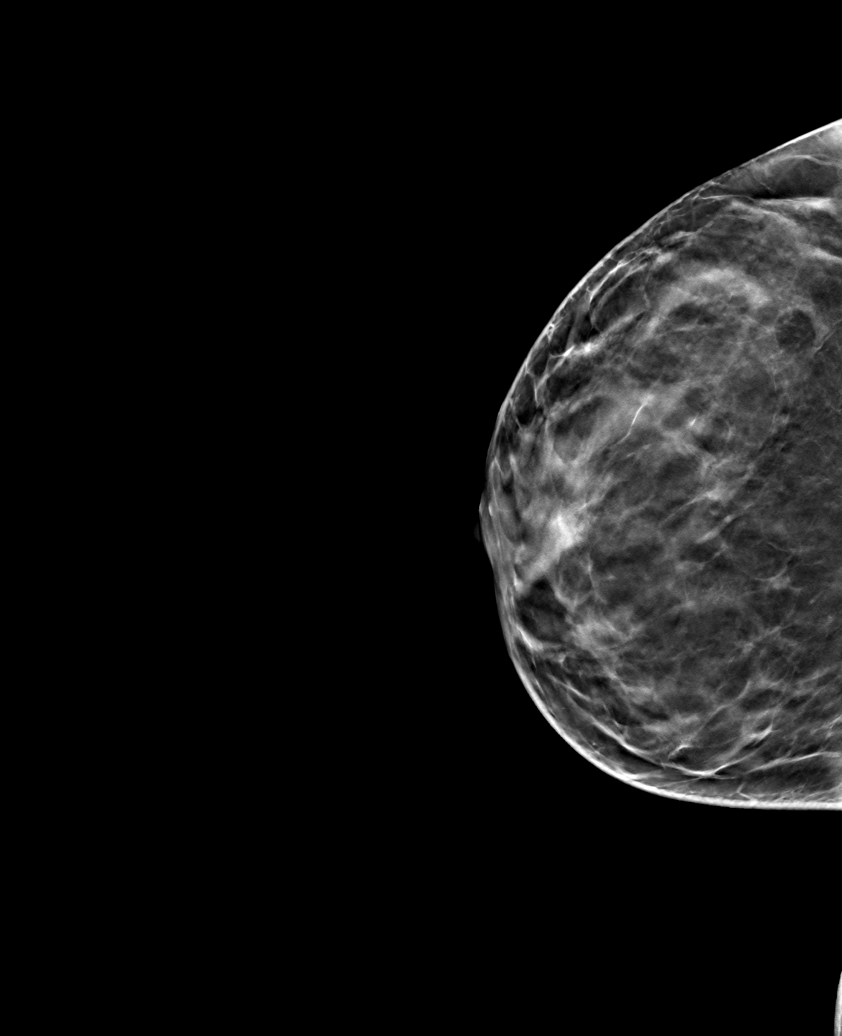

[6 of 30 positions shown; findings below may reference images not displayed]

ACR Breast Density Category c: The breast tissue is heterogeneously
dense, which may obscure small masses.
FINDINGS: No suspicious masses or calcifications are seen in either breast.
Spot compression tomograms were performed over the palpable area of
concern in the left axilla with no definite abnormality seen.

Physical examination at site of palpable concern in the left axilla
reveals a small area of thickening felt to be within the skin.

Targeted ultrasound of the left axilla was performed. There is an
oval circumscribed hypoechoic mass within the skin of the high left
axilla measuring 1 x 0.4 x 1 cm. A thin linear tract is seen
extending to the skin with findings most compatible with a benign
sebaceous cyst.
IMPRESSION: 1. Sebaceous cyst at site of palpable concern in the high left
axilla.

2.  No findings of malignancy in either breast.

RECOMMENDATION:
Screening mammogram at age 40 unless there are persistent or
intervening clinical concerns. (Code:7C-V-4SQ)

I have discussed the findings and recommendations with the patient.
If applicable, a reminder letter will be sent to the patient
regarding the next appointment.

BI-RADS CATEGORY  2: Benign.

## 2023-04-17 ENCOUNTER — Ambulatory Visit
Admission: EM | Admit: 2023-04-17 | Discharge: 2023-04-17 | Disposition: A | Discharge status: Home / Self Care | Payer: Self-pay | Attending: Nurse Practitioner | Admitting: Nurse Practitioner

## 2023-04-17 DIAGNOSIS — M546 Pain in thoracic spine: Secondary | ICD-10-CM

## 2023-04-17 MED ORDER — LIDOCAINE 4 % EX PTCH: 1.0000 | MEDICATED_PATCH | CUTANEOUS | 0 refills | Status: AC

## 2023-04-17 MED ORDER — DEXAMETHASONE SODIUM PHOSPHATE 10 MG/ML IJ SOLN
10.0000 mg | INTRAMUSCULAR | Status: AC
  Administered 2023-04-17: 10 mg via INTRAMUSCULAR

## 2023-04-17 MED ORDER — METHOCARBAMOL 500 MG PO TABS: 500.0000 mg | ORAL_TABLET | Freq: Two times a day (BID) | ORAL | 0 refills | Status: AC

## 2023-04-17 MED ORDER — PREDNISONE 20 MG PO TABS: 40.0000 mg | ORAL_TABLET | Freq: Every day | ORAL | 0 refills | Status: AC

## 2023-04-17 MED ORDER — KETOROLAC TROMETHAMINE 30 MG/ML IJ SOLN
30.0000 mg | Freq: Once | INTRAMUSCULAR | Status: AC
  Administered 2023-04-17: 30 mg via INTRAMUSCULAR

## 2023-04-17 NOTE — ED Provider Notes (Signed)
[facility]    CSN: [id number] Arrival date & time: 04/17/23  1353      History   Chief Complaint No chief complaint on file.   HPI Sandra Shepard is a 32 y.o. female.   The history is provided by the patient.   The patient presents for complaints of pain to the mid back that is been present for the past 6 days.  Patient states the pain is in the mid back where her bra is.  Patient denies any prior injury or trauma.  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  She denies numbness, tingling, radiation of pain, chest pain, shortness of breath, difficulty breathing, or changes in her bowel or bladder function.  Patient states that her pain worsens with prolonged sitting, and when she moves her arms.  She reports she has been taking over-the-counter ibuprofen, Motrin, and Tylenol with minimal relief.  History reviewed. No pertinent past medical history.  Patient Active Problem List   Diagnosis Date Noted   Community acquired pneumonia 07/09/2018   Obesity (BMI 30.0-34.9) 07/09/2018   Status post laparoscopic cholecystectomy 07/09/2018   Cough 07/08/2018    Past Surgical History:  Procedure Laterality Date   CHOLECYSTECTOMY N/A 06/26/2016   Procedure: LAPAROSCOPIC CHOLECYSTECTOMY WITH INTRAOPERATIVE CHOLANGIOGRAM;  Surgeon: Gaynelle Adu, MD;  Location: [facility] OR;  Service: General;  Laterality: N/A;   VIDEO BRONCHOSCOPY Bilateral 07/10/2018   Procedure: VIDEO BRONCHOSCOPY WITH FLUORO;  Surgeon: Chilton Greathouse, MD;  Location: [facility];  Service: Cardiopulmonary;  Laterality: Bilateral;    OB History     Gravida  4   Para      Term      Preterm      AB      Living         SAB      IAB      Ectopic      Multiple      Live Births  4            Home Medications    Prior to Admission medications   Medication Sig Start Date End Date Taking? Authorizing Provider  lidocaine 4 % Place 1 patch onto the  skin daily for 7 days. 04/17/23 04/24/23 Yes Antwion Carpenter-Warren, Sadie Haber, NP  methocarbamol (ROBAXIN) 500 MG tablet Take 1 tablet (500 mg total) by mouth 2 (two) times daily. 04/17/23  Yes Inocente Krach-Warren, Sadie Haber, NP  predniSONE (DELTASONE) 20 MG tablet Take 2 tablets (40 mg total) by mouth daily with breakfast for 5 days. 04/17/23 04/22/23 Yes Kadian Barcellos-Warren, Sadie Haber, NP  ibuprofen (ADVIL,MOTRIN) 200 MG tablet Take 200 mg by mouth every 6 (six) hours as needed for moderate pain. Patient not taking: Reported on 05/11/2021    [provider]    Family History Family History  Problem Relation Age of Onset   Diabetes Mother    Cancer Neg Hx     Social History Social History   Tobacco Use   Smoking status: Never   Smokeless tobacco: Never  Vaping Use   Vaping Use: Never used  Substance Use Topics   Alcohol use: No   Drug use: No     Allergies   Patient has no known allergies.   Review of Systems Review of Systems Per HPI  Physical Exam Triage Vital Signs ED Triage Vitals [04/17/23 1404]  Enc Vitals Group     BP 123/78     Pulse  Rate 85     Resp 18     Temp 98.1 F (36.7 C)     Temp Source Oral     SpO2 99 %     Weight      Height      Head Circumference      Peak Flow      Pain Score 9     Pain Loc      Pain Edu?      Excl. in GC?    No data found.  Updated Vital Signs BP 123/78 (BP Location: Right Arm)   Pulse 85   Temp 98.1 F (36.7 C) (Oral)   Resp 18   LMP 03/21/2023 (Approximate)   SpO2 99%   Visual Acuity Right Eye Distance:   Left Eye Distance:   Bilateral Distance:    Right Eye Near:   Left Eye Near:    Bilateral Near:     Physical Exam Vitals and nursing note reviewed.  Constitutional:      Appearance: Normal appearance. She is not toxic-appearing.  HENT:     Head: Normocephalic.  Eyes:     Extraocular Movements: Extraocular movements intact.     Pupils: Pupils are equal, round, and reactive to light.  Cardiovascular:     Rate  and Rhythm: Normal rate and regular rhythm.     Pulses: Normal pulses.     Heart sounds: Normal heart sounds.  Pulmonary:     Effort: Pulmonary effort is normal. No respiratory distress.     Breath sounds: Normal breath sounds. No stridor. No wheezing, rhonchi or rales.  Abdominal:     General: Bowel sounds are normal.     Palpations: Abdomen is soft.  Musculoskeletal:     Cervical back: Normal range of motion.     Thoracic back: No swelling. Decreased range of motion.  Skin:    General: Skin is warm and dry.  Neurological:     General: No focal deficit present.     Mental Status: She is alert and oriented to person, place, and time.  Psychiatric:        Mood and Affect: Mood normal.        Behavior: Behavior normal.      UC Treatments / Results  Labs (all labs ordered are listed, but only abnormal results are displayed) Labs Reviewed - No data to display  EKG   Radiology No results found.  Procedures Procedures (including critical care time)  Medications Ordered in UC Medications  ketorolac (TORADOL) 30 MG/ML injection 30 mg (30 mg Intramuscular Given 04/17/23 1424)  dexamethasone (DECADRON) injection 10 mg (10 mg Intramuscular Given 04/17/23 1424)    Initial Impression / Assessment and Plan / UC Course  I have reviewed the triage vital signs and the nursing notes.  Pertinent labs & imaging results that were available during my care of the patient were reviewed by me and considered in my medical decision making (see chart for details).  The patient is well-appearing, she is in no acute distress, vital signs are stable.  Patient presents with midthoracic back pain.  There is no inciting injury or trauma; however, patient does clean houses.  Suspect she may have a sprain/strain of the thoracic spine.  Patient was given Decadron 10 mg IM and Toradol 30 mg IM.  Patient was prescribed prednisone 40 mg for the next 5 days for possible inflammation, topical lidocaine  patches 4% to apply to the area to help with pain, and  methocarbamol 500 mg tablets muscle spasm's.  Supportive care recommendations were provided and discussed with the patient to include use of over-the-counter analgesics for pain or discomfort, use of ice or heat, and gentle stretching exercises.  Patient was given strict ER follow-up precautions.  Patient vies if symptoms do not improve, recommend following up with her primary care physician for further evaluation.  The patient was given information for [facility] and for [facility].  Patient is in agreement with this plan of care and verbalizes understanding.  All questions were answered.  Patient stable for discharge.  Work note was provided.  Final Clinical Impressions(s) / UC Diagnoses   Final diagnoses:  Midline thoracic back pain, unspecified chronicity     Discharge Instructions      Take medication as prescribed.  While you are taking prednisone, it is recommended that you take Tylenol.  You can take Tylenol arthritis strength 650 mg tablets as needed for back pain every 8 hours.  When she complete the prednisone, you can resume ibuprofen, Motrin, or Aleve as needed. Try to remain as active as possible.   Gentle stretching and range of motion exercises while symptoms persist.  I have provided you with exercises that you can do at home for your back pain. Gentle range of motion and stretching exercises to help with back spasm and pain. May apply ice or heat as needed.  Ice is recommended for pain or swelling, heat for spasm or stiffness.  Apply for 20 minutes, remove for 1 hour, then repeat. Go to the emergency department immediately if you develop numbness or tingling in your arms, inability to walk, loss of bowel or bladder function, difficulty urinating or passing a bowel movement, or other concerns. If symptoms do not improve with this treatment, I would like you to consider following up with orthopedics for further  evaluation.  You can follow-up with [facility] [place] at [phone] with [facility] at [phone]. Follow-up as needed.     ED Prescriptions     Medication Sig Dispense Auth. Provider   methocarbamol (ROBAXIN) 500 MG tablet Take 1 tablet (500 mg total) by mouth 2 (two) times daily. 20 tablet Scout Guyett-Warren, Sadie Haber, NP   lidocaine 4 % Place 1 patch onto the skin daily for 7 days. 7 patch Sadie Pickar-Warren, Sadie Haber, NP   predniSONE (DELTASONE) 20 MG tablet Take 2 tablets (40 mg total) by mouth daily with breakfast for 5 days. 10 tablet Riti Rollyson-Warren, Sadie Haber, NP      PDMP not reviewed this encounter.   Abran Cantor, NP 04/17/23 1431

## 2023-04-17 NOTE — Discharge Instructions (Addendum)
Take medication as prescribed.  While you are taking prednisone, it is recommended that you take Tylenol.  You can take Tylenol arthritis strength 650 mg tablets as needed for back pain every 8 hours.  When she complete the prednisone, you can resume ibuprofen, Motrin, or Aleve as needed. Try to remain as active as possible.   Gentle stretching and range of motion exercises while symptoms persist.  I have provided you with exercises that you can do at home for your back pain. Gentle range of motion and stretching exercises to help with back spasm and pain. May apply ice or heat as needed.  Ice is recommended for pain or swelling, heat for spasm or stiffness.  Apply for 20 minutes, remove for 1 hour, then repeat. Go to the emergency department immediately if you develop numbness or tingling in your arms, inability to walk, loss of bowel or bladder function, difficulty urinating or passing a bowel movement, or other concerns. If symptoms do not improve with this treatment, I would like you to consider following up with orthopedics for further evaluation.  You can follow-up with [facility] at [phone] with [facility] at [phone]. Follow-up as needed.

## 2023-04-17 NOTE — ED Triage Notes (Signed)
Pt reports her mid  back began to hurt while at work x 6 days. Pt states her back pain worsens with prolonged sitting. Took , motrin and ibuprofen but no relief.   Pt denies injury
